# Patient Record
Sex: Female | Born: 1945 | Race: White | Hispanic: No | Marital: Married | Smoking: Never smoker
Health system: Southern US, Community
[De-identification: ages and names within clinical notes are randomized; demographics above are authoritative.]

## PROBLEM LIST (undated history)

## (undated) DIAGNOSIS — D509 Iron deficiency anemia, unspecified: Secondary | ICD-10-CM

## (undated) DIAGNOSIS — N952 Postmenopausal atrophic vaginitis: Secondary | ICD-10-CM

## (undated) DIAGNOSIS — E039 Hypothyroidism, unspecified: Secondary | ICD-10-CM

## (undated) DIAGNOSIS — A0472 Enterocolitis due to Clostridium difficile, not specified as recurrent: Secondary | ICD-10-CM

## (undated) DIAGNOSIS — M8580 Other specified disorders of bone density and structure, unspecified site: Secondary | ICD-10-CM

## (undated) DIAGNOSIS — L729 Follicular cyst of the skin and subcutaneous tissue, unspecified: Secondary | ICD-10-CM

## (undated) DIAGNOSIS — R109 Unspecified abdominal pain: Secondary | ICD-10-CM

## (undated) DIAGNOSIS — K219 Gastro-esophageal reflux disease without esophagitis: Secondary | ICD-10-CM

## (undated) DIAGNOSIS — K589 Irritable bowel syndrome without diarrhea: Secondary | ICD-10-CM

## (undated) DIAGNOSIS — K5790 Diverticulosis of intestine, part unspecified, without perforation or abscess without bleeding: Secondary | ICD-10-CM

## (undated) DIAGNOSIS — J309 Allergic rhinitis, unspecified: Secondary | ICD-10-CM

## (undated) DIAGNOSIS — G4700 Insomnia, unspecified: Secondary | ICD-10-CM

## (undated) DIAGNOSIS — E7800 Pure hypercholesterolemia, unspecified: Secondary | ICD-10-CM

## (undated) DIAGNOSIS — G2581 Restless legs syndrome: Secondary | ICD-10-CM

## (undated) DIAGNOSIS — F419 Anxiety disorder, unspecified: Secondary | ICD-10-CM

## (undated) DIAGNOSIS — D72829 Elevated white blood cell count, unspecified: Secondary | ICD-10-CM

## (undated) DIAGNOSIS — K529 Noninfective gastroenteritis and colitis, unspecified: Secondary | ICD-10-CM

## (undated) DIAGNOSIS — H9313 Tinnitus, bilateral: Secondary | ICD-10-CM

## (undated) DIAGNOSIS — E079 Disorder of thyroid, unspecified: Secondary | ICD-10-CM

## (undated) DIAGNOSIS — R102 Pelvic and perineal pain: Secondary | ICD-10-CM

## (undated) DIAGNOSIS — M549 Dorsalgia, unspecified: Secondary | ICD-10-CM

## (undated) HISTORY — PX: BREAST ENHANCEMENT SURGERY: SHX7

## (undated) HISTORY — DX: Postmenopausal atrophic vaginitis: N95.2

## (undated) HISTORY — DX: Noninfective gastroenteritis and colitis, unspecified: K52.9

## (undated) HISTORY — PX: UPPER GI ENDOSCOPY: SHX6162

## (undated) HISTORY — DX: Iron deficiency anemia, unspecified: D50.9

## (undated) HISTORY — DX: Diverticulosis of intestine, part unspecified, without perforation or abscess without bleeding: K57.90

## (undated) HISTORY — PX: AUGMENTATION MAMMAPLASTY: SUR837

## (undated) HISTORY — DX: Pelvic and perineal pain: R10.2

## (undated) HISTORY — DX: Anxiety disorder, unspecified: F41.9

## (undated) HISTORY — DX: Restless legs syndrome: G25.81

## (undated) HISTORY — DX: Enterocolitis due to Clostridium difficile, not specified as recurrent: A04.72

## (undated) HISTORY — PX: COLONOSCOPY: SHX174

## (undated) HISTORY — DX: Unspecified abdominal pain: R10.9

## (undated) HISTORY — DX: Hypothyroidism, unspecified: E03.9

## (undated) HISTORY — DX: Dorsalgia, unspecified: M54.9

## (undated) HISTORY — DX: Elevated white blood cell count, unspecified: D72.829

## (undated) HISTORY — PX: BREAST IMPLANT EXCHANGE: SHX6296

## (undated) HISTORY — DX: Pure hypercholesterolemia, unspecified: E78.00

## (undated) HISTORY — DX: Allergic rhinitis, unspecified: J30.9

## (undated) HISTORY — DX: Insomnia, unspecified: G47.00

## (undated) HISTORY — DX: Tinnitus, bilateral: H93.13

---

## 2001-08-07 ENCOUNTER — Encounter: Payer: Self-pay | Admitting: Family Medicine

## 2001-08-07 ENCOUNTER — Ambulatory Visit (HOSPITAL_COMMUNITY): Admission: RE | Admit: 2001-08-07 | Discharge: 2001-08-07 | Payer: Self-pay | Admitting: Family Medicine

## 2003-05-12 ENCOUNTER — Other Ambulatory Visit: Admission: RE | Admit: 2003-05-12 | Discharge: 2003-05-12 | Payer: Self-pay | Admitting: Family Medicine

## 2004-06-13 ENCOUNTER — Other Ambulatory Visit: Admission: RE | Admit: 2004-06-13 | Discharge: 2004-06-13 | Payer: Self-pay | Admitting: Family Medicine

## 2005-07-30 ENCOUNTER — Other Ambulatory Visit: Admission: RE | Admit: 2005-07-30 | Discharge: 2005-07-30 | Payer: Self-pay | Admitting: Family Medicine

## 2005-11-22 ENCOUNTER — Encounter: Admission: RE | Admit: 2005-11-22 | Discharge: 2006-02-20 | Payer: Self-pay | Admitting: Family Medicine

## 2006-08-13 ENCOUNTER — Other Ambulatory Visit: Admission: RE | Admit: 2006-08-13 | Discharge: 2006-08-13 | Payer: Self-pay | Admitting: Family Medicine

## 2007-08-18 ENCOUNTER — Other Ambulatory Visit: Admission: RE | Admit: 2007-08-18 | Discharge: 2007-08-18 | Payer: Self-pay | Admitting: Family Medicine

## 2008-08-18 ENCOUNTER — Other Ambulatory Visit: Admission: RE | Admit: 2008-08-18 | Discharge: 2008-08-18 | Payer: Self-pay | Admitting: Family Medicine

## 2011-10-22 ENCOUNTER — Other Ambulatory Visit (HOSPITAL_COMMUNITY)
Admission: RE | Admit: 2011-10-22 | Discharge: 2011-10-22 | Disposition: A | Discharge status: Home / Self Care | Payer: Medicare PPO | Source: Ambulatory Visit | Attending: Family Medicine | Admitting: Family Medicine

## 2011-10-22 ENCOUNTER — Other Ambulatory Visit: Payer: Self-pay | Admitting: Family Medicine

## 2011-10-22 DIAGNOSIS — Z124 Encounter for screening for malignant neoplasm of cervix: Secondary | ICD-10-CM | POA: Insufficient documentation

## 2014-09-27 ENCOUNTER — Other Ambulatory Visit: Payer: Self-pay | Admitting: Family Medicine

## 2014-09-27 DIAGNOSIS — R1084 Generalized abdominal pain: Secondary | ICD-10-CM

## 2014-09-29 ENCOUNTER — Ambulatory Visit
Admission: RE | Admit: 2014-09-29 | Discharge: 2014-09-29 | Disposition: A | Discharge status: Home / Self Care | Payer: Medicare Other | Source: Ambulatory Visit | Attending: Family Medicine | Admitting: Family Medicine

## 2014-09-29 DIAGNOSIS — R1084 Generalized abdominal pain: Secondary | ICD-10-CM

## 2014-09-29 MED ORDER — IOPAMIDOL (ISOVUE-300) INJECTION 61%
100.0000 mL | Freq: Once | INTRAVENOUS | Status: AC | PRN
  Administered 2014-09-29: 100 mL via INTRAVENOUS

## 2014-11-28 ENCOUNTER — Emergency Department (EMERGENCY_DEPARTMENT_HOSPITAL)
Admit: 2014-11-28 | Discharge: 2014-11-28 | Disposition: A | Discharge status: Home / Self Care | Payer: Medicare Other | Attending: Emergency Medicine | Admitting: Emergency Medicine

## 2014-11-28 ENCOUNTER — Encounter (HOSPITAL_COMMUNITY): Payer: Self-pay | Admitting: Physical Medicine and Rehabilitation

## 2014-11-28 ENCOUNTER — Emergency Department (HOSPITAL_COMMUNITY)
Admission: EM | Admit: 2014-11-28 | Discharge: 2014-11-28 | Disposition: A | Discharge status: Home / Self Care | Payer: Medicare Other | Attending: Emergency Medicine | Admitting: Emergency Medicine

## 2014-11-28 ENCOUNTER — Emergency Department (HOSPITAL_COMMUNITY): Payer: Medicare Other

## 2014-11-28 DIAGNOSIS — R002 Palpitations: Secondary | ICD-10-CM | POA: Insufficient documentation

## 2014-11-28 DIAGNOSIS — E0580 Other thyrotoxicosis without thyrotoxic crisis or storm: Secondary | ICD-10-CM | POA: Insufficient documentation

## 2014-11-28 DIAGNOSIS — M79662 Pain in left lower leg: Secondary | ICD-10-CM | POA: Diagnosis not present

## 2014-11-28 DIAGNOSIS — M79609 Pain in unspecified limb: Secondary | ICD-10-CM | POA: Diagnosis not present

## 2014-11-28 HISTORY — DX: Disorder of thyroid, unspecified: E07.9

## 2014-11-28 LAB — CBC WITH DIFFERENTIAL/PLATELET
Basophils Absolute: 0 10*3/uL (ref 0.0–0.1)
Basophils Relative: 0 %
Eosinophils Absolute: 0 10*3/uL (ref 0.0–0.7)
Eosinophils Relative: 0 %
HCT: 41.9 % (ref 36.0–46.0)
Hemoglobin: 14.5 g/dL (ref 12.0–15.0)
Lymphocytes Relative: 16 %
Lymphs Abs: 1.6 10*3/uL (ref 0.7–4.0)
MCH: 31.8 pg (ref 26.0–34.0)
MCHC: 34.6 g/dL (ref 30.0–36.0)
MCV: 91.9 fL (ref 78.0–100.0)
Monocytes Absolute: 0.3 10*3/uL (ref 0.1–1.0)
Monocytes Relative: 3 %
Neutro Abs: 8 10*3/uL — ABNORMAL HIGH (ref 1.7–7.7)
Neutrophils Relative %: 81 %
Platelets: 342 10*3/uL (ref 150–400)
RBC: 4.56 MIL/uL (ref 3.87–5.11)
RDW: 13 % (ref 11.5–15.5)
WBC: 10 10*3/uL (ref 4.0–10.5)

## 2014-11-28 LAB — I-STAT TROPONIN, ED: Troponin i, poc: 0 ng/mL (ref 0.00–0.08)

## 2014-11-28 LAB — COMPREHENSIVE METABOLIC PANEL
ALT: 28 U/L (ref 14–54)
AST: 31 U/L (ref 15–41)
Albumin: 4.2 g/dL (ref 3.5–5.0)
Alkaline Phosphatase: 75 U/L (ref 38–126)
Anion gap: 17 — ABNORMAL HIGH (ref 5–15)
BUN: 8 mg/dL (ref 6–20)
CO2: 19 mmol/L — ABNORMAL LOW (ref 22–32)
Calcium: 10 mg/dL (ref 8.9–10.3)
Chloride: 103 mmol/L (ref 101–111)
Creatinine, Ser: 0.55 mg/dL (ref 0.44–1.00)
GFR calc Af Amer: >60 mL/min (ref >60)
GFR calc non Af Amer: >60 mL/min (ref >60)
Glucose, Bld: 129 mg/dL — ABNORMAL HIGH (ref 65–99)
Potassium: 3.5 mmol/L (ref 3.5–5.1)
Sodium: 139 mmol/L (ref 135–145)
Total Bilirubin: 0.9 mg/dL (ref 0.3–1.2)
Total Protein: 7.3 g/dL (ref 6.5–8.1)

## 2014-11-28 LAB — TSH: TSH: 0.161 u[IU]/mL — ABNORMAL LOW (ref 0.350–4.500)

## 2014-11-28 LAB — T4, FREE: Free T4: 2.61 ng/dL — ABNORMAL HIGH (ref 0.61–1.12)

## 2014-11-28 NOTE — ED Notes (Signed)
Patient's husband placed in the room. Pt in Xray.

## 2014-11-28 NOTE — ED Notes (Signed)
Pt placed in room.  

## 2014-11-28 NOTE — ED Notes (Signed)
Pt returned from Vascular. 

## 2014-11-28 NOTE — Progress Notes (Signed)
*  PRELIMINARY RESULTS* Vascular Ultrasound Left lower extremity venous duplex has been completed.  Preliminary findings: negative for DVT. Baker's cyst noted.   Landry Mellow, RDMS, RVT  11/28/2014, 1:24 PM

## 2014-11-28 NOTE — ED Notes (Signed)
Pt presents to department for evaluation of palpitations. Pt reports feeling of heart "racing" this morning. Recently reports she had issues with thyroid. Pt denies chest pain, respirations unlabored.

## 2014-11-28 NOTE — Discharge Instructions (Signed)
Hold dose of levothyroxine tomorrow and contact your doctor for further dosing instructions  Palpitations A palpitation is the feeling that your heartbeat is irregular. It may feel like your heart is fluttering or skipping a beat. It may also feel like your heart is beating faster than normal. This is usually not a serious problem. In some cases, you may need more medical tests. HOME CARE  Avoid:  Caffeine in coffee, tea, soft drinks, diet pills, and energy drinks.  Chocolate.  Alcohol.  Stop smoking if you smoke.  Reduce your stress and anxiety. Try:  A method that measures bodily functions so you can learn to control them (biofeedback).  Yoga.  Meditation.  Physical activity such as swimming, jogging, or walking.  Get plenty of rest and sleep. GET HELP IF:  Your fast or irregular heartbeat continues after 24 hours.  Your palpitations occur more often. GET HELP RIGHT AWAY IF:   You have chest pain.  You feel short of breath.  You have a very bad headache.  You feel dizzy or pass out (faint). MAKE SURE YOU:   Understand these instructions.  Will watch your condition.  Will get help right away if you are not doing well or get worse.   This information is not intended to replace advice given to you by your health care provider. Make sure you discuss any questions you have with your health care provider.   Document Released: 10/24/2007 Document Revised: 02/04/2014 Document Reviewed: 03/15/2011 Elsevier Interactive Patient Education [employer].

## 2014-11-28 NOTE — ED Provider Notes (Signed)
CSN: [id number]     Arrival date & time 11/28/14  1114 History   First MD Initiated Contact with Patient 11/28/14 1151     Chief Complaint  Patient presents with  . Palpitations     (Consider location/radiation/quality/duration/timing/severity/associated sxs/prior Treatment) HPI Comments: Presents to the emergency department for evaluation of palpitations. Patient reports that she started to feel like her heart was racing earlier this morning. Heart rate is in the low 100s. She has a history of thyroid disease, is concerned that the symptoms might be from her thyroid. She also, however, did a lot of walking yesterday while shopping and noticed some pain and swelling on the outside part of her lower leg. Her doctor told her that she might have a blood clot and asked her to come to the ER. She is not expressing any chest pain, short of breath, passing out.  Patient is a 69 y.o. female presenting with palpitations.  Palpitations   Past Medical History  Diagnosis Date  . Thyroid disease    History reviewed. No pertinent past surgical history. No family history on file. Social History  Substance Use Topics  . Smoking status: Never Smoker   . Smokeless tobacco: None  . Alcohol Use: No   OB History    No data available     Review of Systems  Cardiovascular: Positive for palpitations.  All other systems reviewed and are negative.     Allergies  Review of patient's allergies indicates no known allergies.  Home Medications   Prior to Admission medications   Not on File   BP 175/91 mmHg  Pulse 106  Temp(Src) 98.4 F (36.9 C) (Oral)  Resp 18  Ht 5\' 5"  (1.651 m)  Wt 140 lb (63.504 kg)  BMI 23.30 kg/m2  SpO2 98% Physical Exam  Constitutional: She is oriented to person, place, and time. She appears well-developed and well-nourished. No distress.  HENT:  Head: Normocephalic and atraumatic.  Right Ear: Hearing normal.  Left Ear: Hearing normal.  Nose: Nose normal.    Mouth/Throat: Oropharynx is clear and moist and mucous membranes are normal.  Eyes: Conjunctivae and EOM are normal. Pupils are equal, round, and reactive to light.  Neck: Normal range of motion. Neck supple.  Cardiovascular: Regular rhythm, S1 normal and S2 normal.  Exam reveals no gallop and no friction rub.   No murmur heard. Pulmonary/Chest: Effort normal and breath sounds normal. No respiratory distress. She exhibits no tenderness.  Abdominal: Soft. Normal appearance and bowel sounds are normal. There is no hepatosplenomegaly. There is no tenderness. There is no rebound, no guarding, no tenderness at McBurney's point and negative Murphy's sign. No hernia.  Musculoskeletal: Normal range of motion.       Left lower leg: She exhibits tenderness. She exhibits no swelling.       Legs: Neurological: She is alert and oriented to person, place, and time. She has normal strength. No cranial nerve deficit or sensory deficit. Coordination normal. GCS eye subscore is 4. GCS verbal subscore is 5. GCS motor subscore is 6.  Skin: Skin is warm, dry and intact. No rash noted. No cyanosis.  Psychiatric: She has a normal mood and affect. Her speech is normal and behavior is normal. Thought content normal.  Nursing note and vitals reviewed.   ED Course  Procedures (including critical care time) Labs Review Labs Reviewed  CBC WITH DIFFERENTIAL/PLATELET - Abnormal; Notable for the following:    Neutro Abs 8.0 (*)    All other  components within normal limits  COMPREHENSIVE METABOLIC PANEL - Abnormal; Notable for the following:    CO2 19 (*)    Glucose, Bld 129 (*)    Anion gap 17 (*)    All other components within normal limits  TSH  I-STAT TROPOININ, ED    Imaging Review Dg Chest 2 View  11/28/2014  CLINICAL DATA:  Palpitations, thyroid disease, symptoms for 3 days worse in last 24 hours EXAM: CHEST  2 VIEW COMPARISON:  None FINDINGS: Normal heart size and pulmonary vascularity. Tortuous thoracic  aorta. Lungs mildly hyperinflated but clear. No pleural effusion or pneumothorax. Bones unremarkable. BILATERAL breast prostheses. IMPRESSION: No acute abnormalities. Electronically Signed   By: Lavonia Dana M.D.   On: 11/28/2014 12:05   I have personally reviewed and evaluated these images and lab results as part of my medical decision-making.   EKG Interpretation   Date/Time:  Monday November 28 2014 11:28:18 EDT Ventricular Rate:  95 PR Interval:  144 QRS Duration: 80 QT Interval:  360 QTC Calculation: 452 R Axis:   -31 Text Interpretation:  Normal sinus rhythm Possible Left atrial enlargement  Left axis deviation Nonspecific ST and T wave abnormality Abnormal ECG No  previous tracing Confirmed by POLLINA  MD, CHRISTOPHER ([id number]) on  11/28/2014 12:18:54 PM      MDM   Final diagnoses:  None  Palpitations Hyperthyroid  Patient presents to the emergency department for evaluation of palpitations. She reports that she was feeling her heart pounding in her chest but has not been tachycardic. Vital signs are stable. Patient does take thyroid supplementation and has had an increase in her dosing. She does have evidence of hyperthyroid today. TSH is low, free T4 is elevated. Patient will hold her thyroid dose tomorrow, contact her primary doctor for further dosing instructions.    Orpah Greek, MD 11/28/14 1504

## 2014-11-28 NOTE — ED Notes (Signed)
MD Pollina at the bedside.  

## 2014-11-29 LAB — T3, FREE: T3, Free: 3.3 pg/mL (ref 2.0–4.4)

## 2016-02-19 ENCOUNTER — Other Ambulatory Visit: Payer: Self-pay | Admitting: Family Medicine

## 2016-02-19 DIAGNOSIS — M79604 Pain in right leg: Secondary | ICD-10-CM

## 2016-02-19 DIAGNOSIS — M25569 Pain in unspecified knee: Secondary | ICD-10-CM

## 2016-02-20 ENCOUNTER — Ambulatory Visit
Admission: RE | Admit: 2016-02-20 | Discharge: 2016-02-20 | Disposition: A | Discharge status: Home / Self Care | Payer: Medicare Other | Source: Ambulatory Visit | Attending: Family Medicine | Admitting: Family Medicine

## 2016-02-20 DIAGNOSIS — M25569 Pain in unspecified knee: Secondary | ICD-10-CM

## 2016-02-20 DIAGNOSIS — M79604 Pain in right leg: Secondary | ICD-10-CM

## 2016-06-19 ENCOUNTER — Ambulatory Visit: Payer: Medicare Other | Admitting: Family Medicine

## 2016-06-28 HISTORY — PX: KNEE ARTHROSCOPY W/ MENISCAL REPAIR: SHX1877

## 2016-08-07 ENCOUNTER — Other Ambulatory Visit (HOSPITAL_COMMUNITY): Payer: Self-pay | Admitting: Specialist

## 2016-08-07 ENCOUNTER — Ambulatory Visit (HOSPITAL_COMMUNITY)
Admission: RE | Admit: 2016-08-07 | Discharge: 2016-08-07 | Disposition: A | Discharge status: Home / Self Care | Payer: Medicare Other | Source: Ambulatory Visit | Attending: Cardiology | Admitting: Cardiology

## 2016-08-07 DIAGNOSIS — M79661 Pain in right lower leg: Secondary | ICD-10-CM | POA: Diagnosis not present

## 2016-08-07 DIAGNOSIS — M7989 Other specified soft tissue disorders: Secondary | ICD-10-CM

## 2017-02-06 ENCOUNTER — Ambulatory Visit: Payer: Self-pay | Admitting: Surgery

## 2017-02-06 NOTE — H&P (Signed)
Connie Diaz Documented: 02/06/2017 11:18 AM Location: [facility] Patient #: [id number] DOB: 03/02/1945 Married / Language: English / Race: White Female  History of Present Illness (Connie Diaz Connie Diaz; 02/06/2017 11:27 AM) Patient words: 72 year old woman is referred for a lump on her left shoulder which has become larger over the last few months. Painful to touch but no recent infection. She states she first noticed it after having meniscus surgery in July. Initially it was pea-sized but has gradually increased in size. Initially it was not mobile but now it is.  Medical history notable for hypothyroidism, tinnitus, diverticulosis, osteopenia GERD, anxiety, insomnia, restless leg syndrome, pelvic and perineal pain, chronic back pain.  The patient is a 72 year old female.   Past Surgical History Connie Diaz, Diaz; 02/06/2017 11:18 AM) Breast Augmentation Bilateral. Knee Surgery Right.  Diagnostic Studies History Connie Diaz, Connie Diaz; 02/06/2017 11:18 AM) Colonoscopy within last year Mammogram within last year Pap Smear 1-5 years ago  Allergies Connie Diaz, Connie Diaz; 02/06/2017 11:21 AM) No Known Allergies [02/06/2017]:  Medication History Connie Diaz, Diaz; 02/06/2017 11:25 AM) Diclofenac Sodium (1% Gel, Transdermal) Active. Allegra (180MG  Tablet, Oral) Active. Aspirin (81MG  Tablet, Oral) Active. Calcium 600+D (600-400MG -UNIT Tablet, Oral) Active. EQL Evening Primrose Oil (500MG  Capsule, Oral) Active. Fluticasone Propionate (50MCG/ACT Suspension, Nasal) Active. Magnesium (200MG  Tablet, Oral) Active. Multivitamin (Oral) Active. Probiotic Product (Oral) Active. Remifemin (20MG  Tablet, Oral) Active. Requip (0.5MG  Tablet, Oral) Active. Vitamin B-12 (100MCG Tablet, Oral) Active. Vitamin B-6 (100MG  Tablet, Oral) Active. Medications Reconciled  Social History Connie Diaz, Diaz; 02/06/2017 11:18 AM) Alcohol use Occasional alcohol use. Caffeine use  Coffee. No drug use Tobacco use Never smoker.  Family History Connie Diaz, Connie Diaz; 02/06/2017 11:18 AM) Heart Disease Father. Ovarian Cancer Mother.  Pregnancy / Birth History Connie Diaz, Connie Diaz; 02/06/2017 11:18 AM) Age at menarche 61 years. Age of menopause 20-50 Contraceptive History Oral contraceptives. Gravida 3 Length (months) of breastfeeding 12-24 Maternal age 68-20 Para 3  Other Problems (Connie Diaz, Connie Diaz; 02/06/2017 11:18 AM) Diverticulosis Thyroid Disease     Review of Systems (Connie Diaz; 02/06/2017 11:18 AM) General Not Present- Appetite Loss, Chills, Fatigue, Fever, Night Sweats, Weight Gain and Weight Loss. Skin Not Present- Change in Wart/Mole, Dryness, Hives, Jaundice, New Lesions, Non-Healing Wounds, Rash and Ulcer. HEENT Present- Ringing in the Ears, Seasonal Allergies and Wears glasses/contact lenses. Not Present- Earache, Hearing Loss, Hoarseness, Nose Bleed, Oral Ulcers, Sinus Pain, Sore Throat, Visual Disturbances and Yellow Eyes. Respiratory Not Present- Bloody sputum, Chronic Cough, Difficulty Breathing, Snoring and Wheezing. Breast Not Present- Breast Mass, Breast Pain, Nipple Discharge and Skin Changes. Cardiovascular Not Present- Chest Pain, Difficulty Breathing Lying Down, Leg Cramps, Palpitations, Rapid Heart Rate, Shortness of Breath and Swelling of Extremities. Gastrointestinal Not Present- Abdominal Pain, Bloating, Bloody Stool, Change in Bowel Habits, Chronic diarrhea, Constipation, Difficulty Swallowing, Excessive gas, Gets full quickly at meals, Hemorrhoids, Indigestion, Nausea, Rectal Pain and Vomiting. Female Genitourinary Not Present- Frequency, Nocturia, Painful Urination, Pelvic Pain and Urgency. Musculoskeletal Not Present- Back Pain, Joint Pain, Joint Stiffness, Muscle Pain, Muscle Weakness and Swelling of Extremities. Neurological Not Present- Decreased Memory, Fainting, Headaches, Numbness, Seizures, Tingling, Tremor, Trouble  walking and Weakness. Psychiatric Not Present- Anxiety, Bipolar, Change in Sleep Pattern, Depression, Fearful and Frequent crying. Endocrine Not Present- Cold Intolerance, Excessive Hunger, Hair Changes, Heat Intolerance, Hot flashes and New Diabetes. Hematology Not Present- Blood Thinners, Easy Bruising, Excessive bleeding, Gland problems, HIV and Persistent Infections.  Vitals (Connie Diaz; 02/06/2017 11:21 AM) 02/06/2017 11:20  AM Weight: 135 lb Height: 63in Body Surface Area: 1.64 m Body Mass Index: 23.91 kg/m  Temp.: 97.47F  Pulse: 57 (Regular)  P.OX: 98% (Room air) BP: 142/92 (Sitting, Left Arm, Standard)      Physical Exam (Connie Diaz; 02/06/2017 11:28 AM)  The physical exam findings are as follows: Note:Alert and oriented, no distress Extraocular motions intact, no scleral icterus Moist mucous membranes, dentition intact Regular rate and rhythm, no pedal edema Unlabored respirations, symmetrical air entry Abdomen soft and nontender no mass or organomegaly Neuro grossly intact, normal gait Extremities warm without deformity, strength symmetrical throughout Psychiatric normal mood and affect, appropriate insight Skin warm and dry, 2 cm mobile subcutaneous cyst in the left superior shoulder, no overlying skin changes.    Assessment & Plan (Connie Diaz; 02/06/2017 11:31 AM)  SUBCUTANEOUS CYST (L72.9) Story: Symptomatic and enlarging cyst on the left shoulder. Plan local excision. Discussed risks of bleeding, infection, pain, scarring, open wound, cyst recurrence. Questions were welcomed and answered. We'll schedule next month. She was going on a cruise in February and would like to wait until this is done.

## 2017-03-07 ENCOUNTER — Encounter (HOSPITAL_BASED_OUTPATIENT_CLINIC_OR_DEPARTMENT_OTHER): Payer: Self-pay

## 2017-03-14 ENCOUNTER — Encounter (HOSPITAL_BASED_OUTPATIENT_CLINIC_OR_DEPARTMENT_OTHER): Payer: Self-pay

## 2017-03-14 ENCOUNTER — Other Ambulatory Visit: Payer: Self-pay

## 2017-03-14 NOTE — Progress Notes (Signed)
Spoke with: Vielka NPO:  After Midnight, no gum, candy, or mints   Arrival time:  0630AM Labs: Hemoglobin AM medications:  Clonazepam, Levothyroxine, Allegra Pre op orders: Yes Ride home:  Jeneen Rinks (husband) [phone]

## 2017-03-19 ENCOUNTER — Ambulatory Visit (HOSPITAL_BASED_OUTPATIENT_CLINIC_OR_DEPARTMENT_OTHER): Payer: Medicare Other | Admitting: Anesthesiology

## 2017-03-19 ENCOUNTER — Other Ambulatory Visit: Payer: Self-pay

## 2017-03-19 ENCOUNTER — Encounter (HOSPITAL_BASED_OUTPATIENT_CLINIC_OR_DEPARTMENT_OTHER)
Admission: RE | Disposition: A | Discharge status: Home / Self Care | Payer: Self-pay | Source: Ambulatory Visit | Attending: Surgery

## 2017-03-19 ENCOUNTER — Encounter (HOSPITAL_BASED_OUTPATIENT_CLINIC_OR_DEPARTMENT_OTHER): Payer: Self-pay | Admitting: *Deleted

## 2017-03-19 ENCOUNTER — Ambulatory Visit (HOSPITAL_BASED_OUTPATIENT_CLINIC_OR_DEPARTMENT_OTHER)
Admission: RE | Admit: 2017-03-19 | Discharge: 2017-03-19 | Disposition: A | Discharge status: Home / Self Care | Payer: Medicare Other | Source: Ambulatory Visit | Attending: Surgery | Admitting: Surgery

## 2017-03-19 DIAGNOSIS — H9319 Tinnitus, unspecified ear: Secondary | ICD-10-CM | POA: Insufficient documentation

## 2017-03-19 DIAGNOSIS — K219 Gastro-esophageal reflux disease without esophagitis: Secondary | ICD-10-CM | POA: Insufficient documentation

## 2017-03-19 DIAGNOSIS — F419 Anxiety disorder, unspecified: Secondary | ICD-10-CM | POA: Insufficient documentation

## 2017-03-19 DIAGNOSIS — K5790 Diverticulosis of intestine, part unspecified, without perforation or abscess without bleeding: Secondary | ICD-10-CM | POA: Diagnosis not present

## 2017-03-19 DIAGNOSIS — M549 Dorsalgia, unspecified: Secondary | ICD-10-CM | POA: Diagnosis not present

## 2017-03-19 DIAGNOSIS — E039 Hypothyroidism, unspecified: Secondary | ICD-10-CM | POA: Insufficient documentation

## 2017-03-19 DIAGNOSIS — Z79899 Other long term (current) drug therapy: Secondary | ICD-10-CM | POA: Insufficient documentation

## 2017-03-19 DIAGNOSIS — Z7982 Long term (current) use of aspirin: Secondary | ICD-10-CM | POA: Diagnosis not present

## 2017-03-19 DIAGNOSIS — Z8249 Family history of ischemic heart disease and other diseases of the circulatory system: Secondary | ICD-10-CM | POA: Diagnosis not present

## 2017-03-19 DIAGNOSIS — Z8041 Family history of malignant neoplasm of ovary: Secondary | ICD-10-CM | POA: Insufficient documentation

## 2017-03-19 DIAGNOSIS — R102 Pelvic and perineal pain: Secondary | ICD-10-CM | POA: Insufficient documentation

## 2017-03-19 DIAGNOSIS — G8929 Other chronic pain: Secondary | ICD-10-CM | POA: Diagnosis not present

## 2017-03-19 DIAGNOSIS — M8580 Other specified disorders of bone density and structure, unspecified site: Secondary | ICD-10-CM | POA: Insufficient documentation

## 2017-03-19 DIAGNOSIS — L729 Follicular cyst of the skin and subcutaneous tissue, unspecified: Secondary | ICD-10-CM | POA: Diagnosis not present

## 2017-03-19 DIAGNOSIS — G4700 Insomnia, unspecified: Secondary | ICD-10-CM | POA: Insufficient documentation

## 2017-03-19 DIAGNOSIS — G2581 Restless legs syndrome: Secondary | ICD-10-CM | POA: Insufficient documentation

## 2017-03-19 HISTORY — DX: Other specified disorders of bone density and structure, unspecified site: M85.80

## 2017-03-19 HISTORY — PX: MASS EXCISION: SHX2000

## 2017-03-19 HISTORY — DX: Follicular cyst of the skin and subcutaneous tissue, unspecified: L72.9

## 2017-03-19 HISTORY — DX: Gastro-esophageal reflux disease without esophagitis: K21.9

## 2017-03-19 HISTORY — DX: Hypothyroidism, unspecified: E03.9

## 2017-03-19 LAB — POCT HEMOGLOBIN-HEMACUE: Hemoglobin: 12.7 g/dL (ref 12.0–15.0)

## 2017-03-19 SURGERY — EXCISION MASS
Anesthesia: Monitor Anesthesia Care | Laterality: Left

## 2017-03-19 MED ORDER — PROMETHAZINE HCL 25 MG/ML IJ SOLN
6.2500 mg | INTRAMUSCULAR | Status: DC | PRN
  Filled 2017-03-19: qty 1

## 2017-03-19 MED ORDER — SODIUM CHLORIDE 0.9% FLUSH
3.0000 mL | Freq: Two times a day (BID) | INTRAVENOUS | Status: DC
  Filled 2017-03-19: qty 3

## 2017-03-19 MED ORDER — ACETAMINOPHEN 650 MG RE SUPP
650.0000 mg | RECTAL | Status: DC | PRN
  Filled 2017-03-19: qty 1

## 2017-03-19 MED ORDER — LIDOCAINE 2% (20 MG/ML) 5 ML SYRINGE
INTRAMUSCULAR | Status: DC | PRN
  Administered 2017-03-19: 50 mg via INTRAVENOUS

## 2017-03-19 MED ORDER — IBUPROFEN 800 MG PO TABS: 800.0000 mg | ORAL_TABLET | Freq: Three times a day (TID) | ORAL | 0 refills | Status: DC | PRN

## 2017-03-19 MED ORDER — CHLORHEXIDINE GLUCONATE 4 % EX LIQD
60.0000 mL | Freq: Once | CUTANEOUS | Status: DC
  Filled 2017-03-19: qty 118

## 2017-03-19 MED ORDER — PROPOFOL 10 MG/ML IV BOLUS
INTRAVENOUS | Status: AC
  Filled 2017-03-19: qty 20

## 2017-03-19 MED ORDER — LIDOCAINE HCL (PF) 1 % IJ SOLN
INTRAMUSCULAR | Status: DC | PRN
  Administered 2017-03-19: 1 mL

## 2017-03-19 MED ORDER — ACETAMINOPHEN 500 MG PO TABS
ORAL_TABLET | ORAL | Status: AC
  Filled 2017-03-19: qty 2

## 2017-03-19 MED ORDER — FENTANYL CITRATE (PF) 100 MCG/2ML IJ SOLN
25.0000 ug | INTRAMUSCULAR | Status: DC | PRN
  Filled 2017-03-19: qty 1

## 2017-03-19 MED ORDER — ACETAMINOPHEN 500 MG PO TABS
1000.0000 mg | ORAL_TABLET | ORAL | Status: AC
  Administered 2017-03-19: 1000 mg via ORAL
  Filled 2017-03-19: qty 2

## 2017-03-19 MED ORDER — FENTANYL CITRATE (PF) 100 MCG/2ML IJ SOLN
INTRAMUSCULAR | Status: DC | PRN
  Administered 2017-03-19: 50 ug via INTRAVENOUS

## 2017-03-19 MED ORDER — FENTANYL CITRATE (PF) 100 MCG/2ML IJ SOLN
INTRAMUSCULAR | Status: AC
  Filled 2017-03-19: qty 2

## 2017-03-19 MED ORDER — SODIUM CHLORIDE 0.9% FLUSH
3.0000 mL | INTRAVENOUS | Status: DC | PRN
  Filled 2017-03-19: qty 3

## 2017-03-19 MED ORDER — ACETAMINOPHEN 325 MG PO TABS
650.0000 mg | ORAL_TABLET | ORAL | Status: DC | PRN
  Filled 2017-03-19: qty 2

## 2017-03-19 MED ORDER — MEPERIDINE HCL 25 MG/ML IJ SOLN
6.2500 mg | INTRAMUSCULAR | Status: DC | PRN
  Filled 2017-03-19: qty 1

## 2017-03-19 MED ORDER — BUPIVACAINE-EPINEPHRINE 0.25% -1:200000 IJ SOLN
INTRAMUSCULAR | Status: DC | PRN
  Administered 2017-03-19: 1 mL

## 2017-03-19 MED ORDER — LIDOCAINE 2% (20 MG/ML) 5 ML SYRINGE
INTRAMUSCULAR | Status: AC
  Filled 2017-03-19: qty 5

## 2017-03-19 MED ORDER — MIDAZOLAM HCL 2 MG/2ML IJ SOLN
INTRAMUSCULAR | Status: AC
  Filled 2017-03-19: qty 2

## 2017-03-19 MED ORDER — SODIUM CHLORIDE 0.9 % IV SOLN
250.0000 mL | INTRAVENOUS | Status: DC | PRN
  Filled 2017-03-19: qty 250

## 2017-03-19 MED ORDER — PROPOFOL 500 MG/50ML IV EMUL
INTRAVENOUS | Status: DC | PRN
  Administered 2017-03-19: 25 ug/kg/min via INTRAVENOUS

## 2017-03-19 MED ORDER — CEFAZOLIN SODIUM-DEXTROSE 2-4 GM/100ML-% IV SOLN
INTRAVENOUS | Status: AC
  Filled 2017-03-19: qty 100

## 2017-03-19 MED ORDER — ACETAMINOPHEN 325 MG PO TABS: 650.0000 mg | ORAL_TABLET | Freq: Four times a day (QID) | ORAL | 0 refills | Status: AC | PRN

## 2017-03-19 MED ORDER — OXYCODONE HCL 5 MG PO TABS
5.0000 mg | ORAL_TABLET | ORAL | Status: DC | PRN
  Filled 2017-03-19: qty 2

## 2017-03-19 MED ORDER — LACTATED RINGERS IV SOLN
INTRAVENOUS | Status: DC
  Administered 2017-03-19: 07:00:00 via INTRAVENOUS
  Filled 2017-03-19: qty 1000

## 2017-03-19 MED ORDER — CEFAZOLIN SODIUM-DEXTROSE 2-4 GM/100ML-% IV SOLN
2.0000 g | INTRAVENOUS | Status: AC
  Administered 2017-03-19: 2 g via INTRAVENOUS
  Filled 2017-03-19: qty 100

## 2017-03-19 MED ORDER — LACTATED RINGERS IV SOLN
INTRAVENOUS | Status: DC
  Filled 2017-03-19: qty 1000

## 2017-03-19 SURGICAL SUPPLY — 33 items
ADH SKN CLS APL DERMABOND .7 (GAUZE/BANDAGES/DRESSINGS) ×1
BLADE SURG 15 STRL LF DISP TIS (BLADE) ×1 IMPLANT
BLADE SURG 15 STRL SS (BLADE) ×2
COVER MAYO STAND STRL (DRAPES) ×2 IMPLANT
COVER TABLE BACK 60X90 (DRAPES) ×2 IMPLANT
DERMABOND ADVANCED (GAUZE/BANDAGES/DRESSINGS) ×1
DERMABOND ADVANCED .7 DNX12 (GAUZE/BANDAGES/DRESSINGS) ×1 IMPLANT
DRAPE LAPAROTOMY 100X72 PEDS (DRAPES) ×1 IMPLANT
DRAPE LAPAROTOMY TRNSV 102X78 (DRAPE) IMPLANT
DRAPE UTILITY XL STRL (DRAPES) ×2 IMPLANT
GAUZE SPONGE 4X4 12PLY STRL (GAUZE/BANDAGES/DRESSINGS) ×2 IMPLANT
GLOVE BIO SURGEON STRL SZ 6 (GLOVE) ×2 IMPLANT
GLOVE INDICATOR 6.5 STRL GRN (GLOVE) ×2 IMPLANT
GOWN STRL REUS W/TWL LRG LVL3 (GOWN DISPOSABLE) ×2 IMPLANT
NDL HYPO 25X1 1.5 SAFETY (NEEDLE) IMPLANT
NEEDLE HYPO 22GX1.5 SAFETY (NEEDLE) IMPLANT
NEEDLE HYPO 25X1 1.5 SAFETY (NEEDLE) IMPLANT
PACK BASIN DAY SURGERY FS (CUSTOM PROCEDURE TRAY) ×2 IMPLANT
PENCIL BUTTON HOLSTER BLD 10FT (ELECTRODE) ×2 IMPLANT
SPONGE LAP 18X18 X RAY DECT (DISPOSABLE) IMPLANT
SPONGE LAP 4X18 X RAY DECT (DISPOSABLE) IMPLANT
STAPLER VISISTAT 35W (STAPLE) IMPLANT
SUCTION FRAZIER TIP 10 FR DISP (SUCTIONS) IMPLANT
SUT MNCRL AB 4-0 PS2 18 (SUTURE) ×2 IMPLANT
SUT VIC AB 2-0 SH 27 (SUTURE) ×2
SUT VIC AB 2-0 SH 27XBRD (SUTURE) ×1 IMPLANT
SUT VIC AB 3-0 SH 27 (SUTURE) ×2
SUT VIC AB 3-0 SH 27X BRD (SUTURE) ×1 IMPLANT
SYR BULB IRRIGATION 50ML (SYRINGE) ×2 IMPLANT
SYR CONTROL 10ML LL (SYRINGE) ×2 IMPLANT
TOWEL OR 17X24 6PK STRL BLUE (TOWEL DISPOSABLE) ×4 IMPLANT
TUBE CONNECTING 12X1/4 (SUCTIONS) IMPLANT
YANKAUER SUCT BULB TIP NO VENT (SUCTIONS) IMPLANT

## 2017-03-19 NOTE — Anesthesia Procedure Notes (Signed)
Procedure Name: MAC Date/Time: 03/19/2017 8:42 AM Performed by: Bonney Aid, CRNA Pre-anesthesia Checklist: Patient identified, Timeout performed, Emergency Drugs available, Suction available and Patient being monitored Patient Re-evaluated:Patient Re-evaluated prior to induction Oxygen Delivery Method: Nasal cannula Placement Confirmation: positive ETCO2

## 2017-03-19 NOTE — Op Note (Signed)
Operative Note  Connie Diaz  [id number]  [id number]  03/19/2017   Surgeon: Vikki Ports A ConnorMD  Assistant: None  Procedure performed: Excision of 1cm cyst from left shoulder. Cyst was subcutaneous but appeared to originate from the clavicle.   Preop diagnosis: left shoudler cyst Post-op diagnosis/intraop findings: same  Specimens: left shoulder cyst Retained items: no EBL: minimal cc Complications: none  Description of procedure: After obtaining informed consent the patient was taken to the operating room and placed supine on operating room table [facility] was initiated, preoperative antibiotics were administered, SCDs applied, and a formal timeout was performed. The area of the cyst in the left shoulder was prepped and draped in usual sterile fashion. After infiltration with local, a 2 cm incision was made over the region of the cyst and the soft tissues were dissected bluntly until the cyst was able to be elevated into the field. Continued sharp and cautery dissection were used to isolate the cyst which although it had seemed to be subcutaneous, did seem to emanate from the clavicle periosteum. Using cautery the cyst was divided from the periosteum and removed. Hemostasis was ensured within the wound. The skin was then closed with one deep dermal 3-0 Vicryl and running subcuticular Monocryl. Dermabond was applied. The patient was then awakened and taken to PACU in stable condition.   All counts were correct at the completion of the case.

## 2017-03-19 NOTE — Anesthesia Preprocedure Evaluation (Addendum)
Anesthesia Evaluation  Patient identified by MRN, date of birth, ID band Patient awake    Reviewed: Allergy & Precautions, NPO status , Patient's Chart, lab work & pertinent test results  Airway Mallampati: I  TM Distance: >3 FB Neck ROM: Full    Dental  (+) Teeth Intact, Dental Advisory Given   Pulmonary neg pulmonary ROS,    breath sounds clear to auscultation       Cardiovascular negative cardio ROS   Rhythm:Regular Rate:Normal     Neuro/Psych negative neurological ROS  negative psych ROS   GI/Hepatic Neg liver ROS, GERD  ,  Endo/Other  Hypothyroidism   Renal/GU negative Renal ROS     Musculoskeletal negative musculoskeletal ROS (+)   Abdominal Normal abdominal exam  (+)   Peds  Hematology negative hematology ROS (+)   Anesthesia Other Findings   Reproductive/Obstetrics                            Anesthesia Physical Anesthesia Plan  ASA: II  Anesthesia Plan: MAC   Post-op Pain Management:    Induction: Intravenous  PONV Risk Score and Plan: 3 and Propofol infusion, Ondansetron and Midazolam  Airway Management Planned: Natural Airway and Simple Face Mask  Additional Equipment: None  Intra-op Plan:   Post-operative Plan:   Informed Consent: I have reviewed the patients History and Physical, chart, labs and discussed the procedure including the risks, benefits and alternatives for the proposed anesthesia with the patient or authorized representative who has indicated his/her understanding and acceptance.     Plan Discussed with: CRNA  Anesthesia Plan Comments:        Anesthesia Quick Evaluation

## 2017-03-19 NOTE — H&P (Signed)
Connie Diaz DOB: 17-Aug-1945 Married / Language: English / Race: White Female  History of Present Illness  Patient words: 72 year old woman is referred for a lump on her left shoulder which has become larger over the last few months. Painful to touch but no recent infection. She states she first noticed it after having meniscus surgery in July. Initially it was pea-sized but has gradually increased in size. Initially it was not mobile but now it is.  Medical history notable for hypothyroidism, tinnitus, diverticulosis, osteopenia GERD, anxiety, insomnia, restless leg syndrome, pelvic and perineal pain, chronic back pain.    Past Surgical History  Breast Augmentation Bilateral. Knee Surgery Right.  Diagnostic Studies History  Colonoscopy within last year Mammogram within last year Pap Smear 1-5 years ago  Allergies  No Known Allergies [02/06/2017]:  Medication History  Diclofenac Sodium (1% Gel, Transdermal) Active. Allegra (180MG  Tablet, Oral) Active. Aspirin (81MG  Tablet, Oral) Active. Calcium 600+D (600-400MG -UNIT Tablet, Oral) Active. EQL Evening Primrose Oil (500MG  Capsule, Oral) Active. Fluticasone Propionate (50MCG/ACT Suspension, Nasal) Active. Magnesium (200MG  Tablet, Oral) Active. Multivitamin (Oral) Active. Probiotic Product (Oral) Active. Remifemin (20MG  Tablet, Oral) Active. Requip (0.5MG  Tablet, Oral) Active. Vitamin B-12 (100MCG Tablet, Oral) Active. Vitamin B-6 (100MG  Tablet, Oral) Active. Medications Reconciled  Social History  Alcohol use Occasional alcohol use. Caffeine use Coffee. No drug use Tobacco use Never smoker.  Family History  Heart Disease Father. Ovarian Cancer Mother.  Pregnancy / Birth History Age at menarche 29 years. Age of menopause 21-50 Contraceptive History Oral contraceptives. Gravida 3 Length (months) of breastfeeding 12-24 Maternal age 45-20 Para 3  Other Problems   Diverticulosis Thyroid Disease     Review of Systems  General Not Present- Appetite Loss, Chills, Fatigue, Fever, Night Sweats, Weight Gain and Weight Loss. Skin Not Present- Change in Wart/Mole, Dryness, Hives, Jaundice, New Lesions, Non-Healing Wounds, Rash and Ulcer. HEENT Present- Ringing in the Ears, Seasonal Allergies and Wears glasses/contact lenses. Not Present- Earache, Hearing Loss, Hoarseness, Nose Bleed, Oral Ulcers, Sinus Pain, Sore Throat, Visual Disturbances and Yellow Eyes. Respiratory Not Present- Bloody sputum, Chronic Cough, Difficulty Breathing, Snoring and Wheezing. Breast Not Present- Breast Mass, Breast Pain, Nipple Discharge and Skin Changes. Cardiovascular Not Present- Chest Pain, Difficulty Breathing Lying Down, Leg Cramps, Palpitations, Rapid Heart Rate, Shortness of Breath and Swelling of Extremities. Gastrointestinal Not Present- Abdominal Pain, Bloating, Bloody Stool, Change in Bowel Habits, Chronic diarrhea, Constipation, Difficulty Swallowing, Excessive gas, Gets full quickly at meals, Hemorrhoids, Indigestion, Nausea, Rectal Pain and Vomiting. Female Genitourinary Not Present- Frequency, Nocturia, Painful Urination, Pelvic Pain and Urgency. Musculoskeletal Not Present- Back Pain, Joint Pain, Joint Stiffness, Muscle Pain, Muscle Weakness and Swelling of Extremities. Neurological Not Present- Decreased Memory, Fainting, Headaches, Numbness, Seizures, Tingling, Tremor, Trouble walking and Weakness. Psychiatric Not Present- Anxiety, Bipolar, Change in Sleep Pattern, Depression, Fearful and Frequent crying. Endocrine Not Present- Cold Intolerance, Excessive Hunger, Hair Changes, Heat Intolerance, Hot flashes and New Diabetes. Hematology Not Present- Blood Thinners, Easy Bruising, Excessive bleeding, Gland problems, HIV and Persistent Infections.  Vitals:   03/19/17 0620  BP: 137/86  Pulse: 65  Resp: 16  Temp: 97.8 F (36.6 C)  SpO2: 96%       Physical Exam   The physical exam findings are as follows: Note:Alert and oriented, no distress Extraocular motions intact, no scleral icterus Moist mucous membranes, dentition intact Regular rate and rhythm, no pedal edema Unlabored respirations, symmetrical air entry Abdomen soft and nontender no mass or organomegaly Neuro grossly intact, normal gait  Extremities warm without deformity, strength symmetrical throughout Psychiatric normal mood and affect, appropriate insight Skin warm and dry, 1 cm mobile subcutaneous cyst in the left superior shoulder, no overlying skin changes.    Assessment & Plan   SUBCUTANEOUS CYST (L72.9) Story: Symptomatic cyst on the left shoulder. Has decreased in size in the last month. Plan local excision. Discussed risks of bleeding, infection, pain, scarring, open wound, cyst recurrence. Questions were welcomed and answered.

## 2017-03-19 NOTE — Transfer of Care (Signed)
Immediate Anesthesia Transfer of Care Note  Patient: Connie Diaz  Procedure(s) Performed: EXCISION LEFT SHOULDER CYST (Left )  Patient Location: PACU  Anesthesia Type:MAC  Level of Consciousness: awake, alert  and oriented  Airway & Oxygen Therapy: Patient Spontanous Breathing  Post-op Assessment: Report given to RN and Post -op Vital signs reviewed and stable  Post vital signs: Reviewed and stable  Last Vitals:  Vitals:   03/19/17 0620 03/19/17 0905  BP: 137/86 125/78  Pulse: 65 (!) 59  Resp: 16 16  Temp: 36.6 C (!) 36.3 C  SpO2: 96% 94%    Last Pain:  Vitals:   03/19/17 0620  TempSrc: Oral         Complications: No apparent anesthesia complications

## 2017-03-19 NOTE — Anesthesia Postprocedure Evaluation (Signed)
Anesthesia Post Note  Patient: Connie Diaz  Procedure(s) Performed: EXCISION LEFT SHOULDER CYST (Left )     Patient location during evaluation: PACU Anesthesia Type: MAC Level of consciousness: awake and alert Pain management: pain level controlled Vital Signs Assessment: post-procedure vital signs reviewed and stable Respiratory status: spontaneous breathing, nonlabored ventilation, respiratory function stable and patient connected to nasal cannula oxygen Cardiovascular status: stable and blood pressure returned to baseline Postop Assessment: no apparent nausea or vomiting Anesthetic complications: no    Last Vitals:  Vitals:   03/19/17 0915 03/19/17 0930  BP: 129/70 (!) 143/78  Pulse: 63 (!) 54  Resp: 18 10  Temp:    SpO2: 93% 98%    Last Pain:  Vitals:   03/19/17 0620  TempSrc: Oral                 Effie Berkshire

## 2017-03-19 NOTE — Discharge Instructions (Signed)
GENERAL SURGERY: POST OP INSTRUCTIONS  ######################################################################  EAT Gradually transition to a high fiber diet with a fiber supplement over the next few weeks after discharge.  Start with a pureed / full liquid diet (see below)  WALK Walk an hour a day.  Control your pain to do that.    CONTROL PAIN Control pain so that you can walk, sleep, tolerate sneezing/coughing, go up/down stairs.  HAVE A BOWEL MOVEMENT DAILY Keep your bowels regular to avoid problems.  OK to try a laxative to override constipation.  OK to use an antidairrheal to slow down diarrhea.  Call if not better after 2 tries  CALL IF YOU HAVE PROBLEMS/CONCERNS Call if you are still struggling despite following these instructions. Call if you have concerns not answered by these instructions  ######################################################################    1. DIET: Follow a light bland diet the first 24 hours after arrival home, such as soup, liquids, crackers, etc.  Be sure to include lots of fluids daily.  Avoid fast food or heavy meals as your are more likely to get nauseated.   2. Take your usually prescribed home medications unless otherwise directed. 3. PAIN CONTROL: a. Pain is best controlled by a usual combination of three different methods TOGETHER: i. Ice/Heat ii. Over the counter pain medication iii. Prescription pain medication b. Most patients will experience some swelling and bruising around the incisions.  Ice packs or heating pads (30-60 minutes up to 6 times a day) will help. Use ice for the first few days to help decrease swelling and bruising, then switch to heat to help relax tight/sore spots and speed recovery.  Some people prefer to use ice alone, heat alone, alternating between ice & heat.  Experiment to what works for you.  Swelling and bruising can take several weeks to resolve.   c. It is helpful to take an over-the-counter pain medication  regularly for the first few weeks.  Choose one of the following that works best for you: i. Naproxen (Aleve, etc)  Two 220mg  tabs twice a day ii. Ibuprofen (Advil, etc) Three 200mg  tabs four times a day (every meal & bedtime) iii. Acetaminophen (Tylenol, etc) 500-650mg  four times a day (every meal & bedtime) iv. If you are having problems/concerns with the pain, please call us [phone] to see if we need to switch you to a different pain medicine that will work better for you and/or control your side effect better. v. If you need a refill on your pain medication, please contact your pharmacy.  They will contact our office to request authorization. Prescriptions will not be filled after 5 pm or on week-ends. 4. Avoid getting constipated.  Between the surgery and the pain medications, it is common to experience some constipation.  Increasing fluid intake and taking a fiber supplement (such as Metamucil, Citrucel, FiberCon, MiraLax, etc) 1-2 times a day regularly will usually help prevent this problem from occurring.  A mild laxative (prune juice, Milk of Magnesia, MiraLax, etc) should be taken according to package directions if there are no bowel movements after 48 hours.   5. Wash / shower every day.  You may shower over the skin glue which is water proof.  Continue to shower over incision(s) after the dressing is off. 6. Skin glue will flake off after about 2 weeks.  You may leave the incision open to air.  You may replace a dressing/Band-Aid to cover the incision for comfort if you wish.      7. ACTIVITIES  as tolerated:   a. You may resume regular (light) daily activities beginning the next day--such as daily self-care, walking, climbing stairs--gradually increasing activities as tolerated.  If you can walk 30 minutes without difficulty, it is safe to try more intense activity such as jogging, treadmill, bicycling, low-impact aerobics, swimming, etc. b. Save the most intensive and strenuous  activity for last such as sit-ups, heavy lifting, contact sports, etc  Refrain from any heavy lifting or straining until you are off narcotics for pain control.   c. DO NOT PUSH THROUGH PAIN.  Let pain be your guide: If it hurts to do something, don't do it.  Pain is your body warning you to avoid that activity for another week until the pain goes down. d. You may drive when you are no longer taking prescription pain medication, you can comfortably wear a seatbelt, and you can safely maneuver your car and apply brakes. e. Dennis Bast may have sexual intercourse when it is comfortable.  8. FOLLOW UP in our office a. Please call [facility] at [phone] to set up an appointment to see your surgeon in the office for a follow-up appointment approximately 2-3 weeks after your surgery. b. Make sure that you call for this appointment the day you arrive home to insure a convenient appointment time. 9. IF YOU HAVE DISABILITY OR FAMILY LEAVE FORMS, BRING THEM TO THE OFFICE FOR PROCESSING.  DO NOT GIVE THEM TO YOUR DOCTOR.   WHEN TO CALL us [phone]: 1. Poor pain control 2. Reactions / problems with new medications (rash/itching, nausea, etc)  3. Fever over 101.5 F (38.5 C) 4. Worsening swelling or bruising 5. Continued bleeding from incision. 6. Increased pain, redness, or drainage from the incision 7. Difficulty breathing / swallowing   The clinic staff is available to answer your questions during regular business hours (8:30am-5pm).  Please dont hesitate to call and ask to speak to one of our nurses for clinical concerns.   If you have a medical emergency, go to the nearest emergency room or call 911.  A surgeon from [facility] is always on call at the [facility], [place], [place], [place], [place]  [id number] ? MAIN: [phone] ? TOLL FREE: [phone] ?  FAX [phone] [email or web address]    Post Anesthesia Home Care  Instructions  Activity: Get plenty of rest for the remainder of the day. A responsible individual must stay with you for 24 hours following the procedure.  For the next 24 hours, DO NOT: -Drive a car -Paediatric nurse -Drink alcoholic beverages -Take any medication unless instructed by your physician -Make any legal decisions or sign important papers.  Meals: Start with liquid foods such as gelatin or soup. Progress to regular foods as tolerated. Avoid greasy, spicy, heavy foods. If nausea and/or vomiting occur, drink only clear liquids until the nausea and/or vomiting subsides. Call your physician if vomiting continues.  Special Instructions/Symptoms: Your throat may feel dry or sore from the anesthesia or the breathing tube placed in your throat during surgery. If this causes discomfort, gargle with warm salt water. The discomfort should disappear within 24 hours.  If you had a scopolamine patch placed behind your ear for the management of post- operative nausea and/or vomiting:  1. The medication in the patch is effective for 72 hours, after which it should be removed.  Wrap patch in a tissue and discard in the trash. Wash hands thoroughly with soap and  include dry mouth, dizziness or visual disturbances. 3. Avoid touching the patch. Wash your hands with soap and water after contact with the patch.      

## 2017-03-20 ENCOUNTER — Encounter (HOSPITAL_BASED_OUTPATIENT_CLINIC_OR_DEPARTMENT_OTHER): Payer: Self-pay | Admitting: Surgery

## 2017-07-28 ENCOUNTER — Other Ambulatory Visit: Payer: Self-pay | Admitting: Family Medicine

## 2017-07-28 DIAGNOSIS — R102 Pelvic and perineal pain: Secondary | ICD-10-CM

## 2017-08-06 ENCOUNTER — Other Ambulatory Visit: Payer: Medicare Other

## 2018-04-13 ENCOUNTER — Other Ambulatory Visit: Payer: Self-pay | Admitting: Family Medicine

## 2018-04-13 DIAGNOSIS — E0789 Other specified disorders of thyroid: Secondary | ICD-10-CM

## 2018-04-14 ENCOUNTER — Ambulatory Visit
Admission: RE | Admit: 2018-04-14 | Discharge: 2018-04-14 | Disposition: A | Discharge status: Home / Self Care | Payer: Medicare Other | Source: Ambulatory Visit | Attending: Family Medicine | Admitting: Family Medicine

## 2018-04-14 DIAGNOSIS — E0789 Other specified disorders of thyroid: Secondary | ICD-10-CM

## 2019-03-20 ENCOUNTER — Ambulatory Visit: Payer: Medicare Other

## 2019-08-13 ENCOUNTER — Emergency Department (HOSPITAL_COMMUNITY): Payer: Medicare Other

## 2019-08-13 ENCOUNTER — Other Ambulatory Visit: Payer: Self-pay

## 2019-08-13 ENCOUNTER — Encounter (HOSPITAL_COMMUNITY): Payer: Self-pay

## 2019-08-13 ENCOUNTER — Emergency Department (HOSPITAL_COMMUNITY)
Admission: EM | Admit: 2019-08-13 | Discharge: 2019-08-13 | Disposition: A | Discharge status: Home / Self Care | Payer: Medicare Other | Attending: Emergency Medicine | Admitting: Emergency Medicine

## 2019-08-13 DIAGNOSIS — Y929 Unspecified place or not applicable: Secondary | ICD-10-CM | POA: Diagnosis not present

## 2019-08-13 DIAGNOSIS — Y998 Other external cause status: Secondary | ICD-10-CM | POA: Diagnosis not present

## 2019-08-13 DIAGNOSIS — W010XXA Fall on same level from slipping, tripping and stumbling without subsequent striking against object, initial encounter: Secondary | ICD-10-CM | POA: Diagnosis not present

## 2019-08-13 DIAGNOSIS — Z7982 Long term (current) use of aspirin: Secondary | ICD-10-CM | POA: Insufficient documentation

## 2019-08-13 DIAGNOSIS — S0083XA Contusion of other part of head, initial encounter: Secondary | ICD-10-CM | POA: Insufficient documentation

## 2019-08-13 DIAGNOSIS — E039 Hypothyroidism, unspecified: Secondary | ICD-10-CM | POA: Insufficient documentation

## 2019-08-13 DIAGNOSIS — Y93K1 Activity, walking an animal: Secondary | ICD-10-CM | POA: Diagnosis not present

## 2019-08-13 DIAGNOSIS — S4291XA Fracture of right shoulder girdle, part unspecified, initial encounter for closed fracture: Secondary | ICD-10-CM | POA: Insufficient documentation

## 2019-08-13 DIAGNOSIS — W19XXXA Unspecified fall, initial encounter: Secondary | ICD-10-CM

## 2019-08-13 DIAGNOSIS — S4991XA Unspecified injury of right shoulder and upper arm, initial encounter: Secondary | ICD-10-CM | POA: Diagnosis present

## 2019-08-13 DIAGNOSIS — Z79899 Other long term (current) drug therapy: Secondary | ICD-10-CM | POA: Insufficient documentation

## 2019-08-13 NOTE — Progress Notes (Signed)
Orthopedic Tech Progress Note Patient Details:  Connie Diaz May 09, 1945 [id number]  Ortho Devices Type of Ortho Device: Arm sling Ortho Device/Splint Location: RUE Ortho Device/Splint Interventions: Application, Adjustment   Post Interventions Patient Tolerated: Well Instructions Provided: Adjustment of device   Jamani Eley E Maansi Wike 08/13/2019, 10:04 PM

## 2019-08-13 NOTE — ED Provider Notes (Signed)
[facility] EMERGENCY DEPARTMENT Provider Note   CSN: [id number] Arrival date & time: 08/13/19  2011     History Chief Complaint  Patient presents with  . Fall    Connie Diaz is a 74 y.o. female.  The history is provided by the patient.  Fall This is a new (Pt was walking into the house and her dog tripped her and she fell on the concrete mostly on her right arm) problem. The current episode started 1 to 2 hours ago. The problem occurs constantly. The problem has not changed since onset.Associated symptoms comments: Right shoulder pain.  [address] and hit her nose on the concrete and upper lip.  No LOC or head injury.  No neck pain.  Ambulating without difficulty.. The symptoms are aggravated by bending and twisting. Relieved by: being still and keeping the arm over the head. She has tried nothing for the symptoms. The treatment provided no relief.       Past Medical History:  Diagnosis Date  . GERD (gastroesophageal reflux disease)   . Hypothyroidism   . Osteopenia   . Subcutaneous cyst    Left shoulder  . Thyroid disease     There are no problems to display for this patient.   Past Surgical History:  Procedure Laterality Date  . BREAST ENHANCEMENT SURGERY    . BREAST IMPLANT EXCHANGE    . COLONOSCOPY    . KNEE ARTHROSCOPY W/ MENISCAL REPAIR Right 06/2016  . MASS EXCISION Left 03/19/2017   Procedure: EXCISION LEFT SHOULDER CYST;  Surgeon: Clovis Riley, MD;  Location: [facility];  Service: General;  Laterality: Left;  . UPPER GI ENDOSCOPY       OB History   No obstetric history on file.     No family history on file.  Social History   Tobacco Use  . Smoking status: Never Smoker  . Smokeless tobacco: Never Used  Vaping Use  . Vaping Use: Never used  Substance Use Topics  . Alcohol use: Yes    Comment: wine in the afternoon  . Drug use: No    Home Medications Prior to Admission medications   Medication Sig Start  Date End Date Taking? Authorizing Provider  acetaminophen (TYLENOL) 500 MG tablet Take 500 mg by mouth See admin instructions. Takes 1 tablet in the morning and 2 tablets at night   Yes [provider]  Ascorbic Acid (VITAMIN C) 1000 MG tablet Take 1,000 mg by mouth daily.   Yes [provider]  aspirin EC 81 MG tablet Take 81 mg by mouth daily.   Yes [provider]  Calcium Carbonate-Vitamin D 600-125 MG-UNIT TABS Take 1 tablet by mouth 2 (two) times daily.    Yes [provider]  clonazePAM (KLONOPIN) 1 MG tablet Take 1 mg by mouth 2 (two) times daily.   Yes [provider]  fexofenadine (ALLEGRA) 180 MG tablet Take 180 mg by mouth daily.   Yes [provider]  fluticasone (FLONASE) 50 MCG/ACT nasal spray Place 1 spray into the nose daily as needed for allergies or rhinitis.   Yes [provider]  hyoscyamine (ANASPAZ) 0.125 MG TBDP disintergrating tablet Place 0.125 mg under the tongue every 4 (four) hours as needed for bladder spasms or cramping.    Yes [provider]  Lactobacillus (PROBIOTIC ACIDOPHILUS PO) Take 1 capsule by mouth daily.   Yes [provider]  levothyroxine (SYNTHROID) 75 MCG tablet Take 75 mcg by  mouth daily before breakfast.   Yes [provider]  MAGNESIUM CARBONATE PO Take 1 tablet by mouth daily.   Yes [provider]  Multiple Vitamin (MULTIVITAMIN WITH MINERALS) TABS tablet Take 1 tablet by mouth daily.   Yes [provider]  Propylene Glycol (SYSTANE COMPLETE OP) Place 1 drop into both eyes daily as needed (for dry eyes).   Yes [provider]  rOPINIRole (REQUIP) 0.5 MG tablet Take 0.5 mg by mouth every evening.    Yes [provider]  sertraline (ZOLOFT) 50 MG tablet Take 50 mg by mouth daily.   Yes [provider]  traZODone (DESYREL) 50 MG tablet Take 25 mg by mouth at bedtime.    Yes [provider]  zinc gluconate 50 MG  tablet Take 50 mg by mouth daily.   Yes [provider]  ibuprofen (ADVIL,MOTRIN) 800 MG tablet Take 1 tablet (800 mg total) by mouth every 8 (eight) hours as needed for mild pain or moderate pain. Patient not taking: Reported on 08/13/2019 03/19/17   Clovis Riley, MD    Allergies    Codeine and Other  Review of Systems   Review of Systems  All other systems reviewed and are negative.   Physical Exam Updated Vital Signs BP (!) 158/77 (BP Location: Left Arm)   Pulse 63   Temp 97.6 F (36.4 C) (Oral)   Resp 18   SpO2 95%   Physical Exam Vitals and nursing note reviewed.  Constitutional:      General: She is not in acute distress.    Appearance: Normal appearance. She is well-developed and normal weight.  HENT:     Head: Normocephalic. Contusion present.      Mouth/Throat:     Mouth: Mucous membranes are moist.     Comments: No dental injury Eyes:     Pupils: Pupils are equal, round, and reactive to light.  Cardiovascular:     Rate and Rhythm: Normal rate and regular rhythm.     Heart sounds: Normal heart sounds. No murmur heard.  No friction rub.  Pulmonary:     Effort: Pulmonary effort is normal.     Breath sounds: Normal breath sounds. No wheezing or rales.  Abdominal:     General: Bowel sounds are normal. There is no distension.     Palpations: Abdomen is soft.     Tenderness: There is no abdominal tenderness. There is no guarding or rebound.  Musculoskeletal:        General: No tenderness. Normal range of motion.     Right lower leg: No edema.     Left lower leg: No edema.     Comments: Pt holding her right arm over the head.  Deformity noted but normal sensation in the right hand.  2+ radial pulse and 5/5 hand grip strength.  No signs of right elbow or wrist injury.  Skin:    General: Skin is warm and dry.     Findings: No rash.  Neurological:     General: No focal deficit present.     Mental Status: She is alert and oriented to person, place,  and time. Mental status is at baseline.     Cranial Nerves: No cranial nerve deficit.  Psychiatric:        Mood and Affect: Mood normal.        Behavior: Behavior normal.        Thought Content: Thought content normal.     ED Results /  Procedures / Treatments   Labs (all labs ordered are listed, but only abnormal results are displayed) Labs Reviewed - No data to display  EKG None  Radiology DG Shoulder Right  Result Date: 08/13/2019 CLINICAL DATA:  Right shoulder pain after fall. EXAM: RIGHT SHOULDER - 2+ VIEW COMPARISON:  None. FINDINGS: Anterior dislocation of proximal right humeral head is noted. No definite fracture is noted. Visualized ribs are unremarkable. IMPRESSION: Anterior dislocation of proximal right humeral head. Electronically Signed   By: Marijo Conception M.D.   On: 08/13/2019 20:49   DG Shoulder Right Portable  Result Date: 08/13/2019 CLINICAL DATA:  Dislocation EXAM: PORTABLE RIGHT SHOULDER COMPARISON:  Earlier same day FINDINGS: Right humeral head is now anatomic location with respect to the glenoid. No acute fracture. IMPRESSION: Anatomic alignment at the right glenohumeral joint. Electronically Signed   By: Macy Mis M.D.   On: 08/13/2019 22:07    Procedures Reduction of dislocation  Date/Time: 08/13/2019 11:07 PM Performed by: Blanchie Dessert, MD Authorized by: Blanchie Dessert, MD  Local anesthesia used: no  Anesthesia: Local anesthesia used: no  Sedation: Patient sedated: no  Patient tolerance: patient tolerated the procedure well with no immediate complications Comments: Pt was placed in the prone position and right arm hanging off the bed.  With gentle traction and slow extension/supination had reduction of dislocation.    (including critical care time)  Medications Ordered in ED Medications - No data to display  ED Course  I have reviewed the triage vital signs and the nursing notes.  Pertinent labs & imaging results that were  available during my care of the patient were reviewed by me and considered in my medical decision making (see chart for details).    MDM Rules/Calculators/A&P                          Patient presenting today after a fall where she tripped over her dog.  She landed on her right shoulder and has had severe pain in the shoulder since.  She denies any chest injury, loss of consciousness or head injury but did scrape her nose and upper lip.  She takes aspirin but no other anticoagulants.  She has no neck pain and is otherwise well-appearing.  Plain film shows a right anterior shoulder dislocation.  Patient is neurovascularly intact.  Patient prefers not to be sedated and would like to try a reduction awake.  She is calm and relaxed and placed in the prone position within several minutes of traction of the affected arm reduction was achieved.  Repeat images show reduced shoulder and patient was placed in immobilizer and given follow-up with orthopedics.  Low suspicion for intracranial injury or facial fractures at this time and do not feel that patient needs further imaging.   Final Clinical Impression(s) / ED Diagnoses Final diagnoses:  Fall, initial encounter  Traumatic closed displaced fracture of right shoulder with anterior dislocation, initial encounter  Facial contusion, initial encounter    Rx / DC Orders ED Discharge Orders    None       Blanchie Dessert, MD 08/13/19 2309

## 2019-08-13 NOTE — ED Triage Notes (Addendum)
Pt arrives to ED w/ c/o fall. Pt states she landed on her R shoulder and nose. Pt reports 9/10 R shoulder pain and states she "can't put it down".  Pt denies loc. AOx4, neuro intact.

## 2019-08-13 NOTE — ED Notes (Signed)
Ortho tech called for immobilizer

## 2019-08-13 NOTE — ED Notes (Signed)
Discharge instructions discussed with pt. Pt verbalized understanding. Pt stable and ambulatory. No signature pad available. 

## 2019-08-13 NOTE — Discharge Instructions (Signed)
Wear the sling for the next 1 week but you can start ranging your arm with small movements.  After 1 week you can stop sling and start doing more full range of motion exercises with your right arm.

## 2019-11-30 ENCOUNTER — Ambulatory Visit: Payer: Medicare Other | Attending: Internal Medicine

## 2019-11-30 DIAGNOSIS — Z23 Encounter for immunization: Secondary | ICD-10-CM

## 2019-11-30 NOTE — Progress Notes (Signed)
   Covid-19 Vaccination Clinic  Name:  BRANDY ZUBA    MRN: [id number] DOB: 01-01-1946  11/30/2019  Ms. Severs was observed post Covid-19 immunization for 15 minutes without incident. She was provided with Vaccine Information Sheet and instruction to access the V-Safe system.   Ms. Purdy was instructed to call 911 with any severe reactions post vaccine: Marland Kitchen Difficulty breathing  . Swelling of face and throat  . A fast heartbeat  . A bad rash all over body  . Dizziness and weakness

## 2019-12-03 ENCOUNTER — Other Ambulatory Visit: Payer: Self-pay | Admitting: Obstetrics and Gynecology

## 2019-12-03 DIAGNOSIS — Z1231 Encounter for screening mammogram for malignant neoplasm of breast: Secondary | ICD-10-CM

## 2020-01-12 ENCOUNTER — Other Ambulatory Visit: Payer: Self-pay

## 2020-01-12 ENCOUNTER — Ambulatory Visit
Admission: RE | Admit: 2020-01-12 | Discharge: 2020-01-12 | Disposition: A | Discharge status: Home / Self Care | Payer: Medicare Other | Source: Ambulatory Visit | Attending: Obstetrics and Gynecology | Admitting: Obstetrics and Gynecology

## 2020-01-12 DIAGNOSIS — Z1231 Encounter for screening mammogram for malignant neoplasm of breast: Secondary | ICD-10-CM

## 2020-03-07 DIAGNOSIS — E039 Hypothyroidism, unspecified: Secondary | ICD-10-CM | POA: Diagnosis not present

## 2020-03-27 DIAGNOSIS — G2581 Restless legs syndrome: Secondary | ICD-10-CM | POA: Diagnosis not present

## 2020-03-27 DIAGNOSIS — E039 Hypothyroidism, unspecified: Secondary | ICD-10-CM | POA: Diagnosis not present

## 2020-03-27 DIAGNOSIS — R5383 Other fatigue: Secondary | ICD-10-CM | POA: Diagnosis not present

## 2020-06-07 DIAGNOSIS — H40011 Open angle with borderline findings, low risk, right eye: Secondary | ICD-10-CM | POA: Diagnosis not present

## 2020-06-07 DIAGNOSIS — H401121 Primary open-angle glaucoma, left eye, mild stage: Secondary | ICD-10-CM | POA: Diagnosis not present

## 2020-06-16 ENCOUNTER — Other Ambulatory Visit: Payer: Self-pay

## 2020-06-16 ENCOUNTER — Emergency Department (HOSPITAL_COMMUNITY)
Admission: EM | Admit: 2020-06-16 | Discharge: 2020-06-16 | Disposition: A | Discharge status: Home / Self Care | Payer: Medicare Other | Attending: Emergency Medicine | Admitting: Emergency Medicine

## 2020-06-16 ENCOUNTER — Encounter (HOSPITAL_COMMUNITY): Payer: Self-pay | Admitting: *Deleted

## 2020-06-16 DIAGNOSIS — Z79899 Other long term (current) drug therapy: Secondary | ICD-10-CM | POA: Insufficient documentation

## 2020-06-16 DIAGNOSIS — E039 Hypothyroidism, unspecified: Secondary | ICD-10-CM | POA: Insufficient documentation

## 2020-06-16 DIAGNOSIS — K644 Residual hemorrhoidal skin tags: Secondary | ICD-10-CM | POA: Insufficient documentation

## 2020-06-16 DIAGNOSIS — Z7982 Long term (current) use of aspirin: Secondary | ICD-10-CM | POA: Diagnosis not present

## 2020-06-16 DIAGNOSIS — K625 Hemorrhage of anus and rectum: Secondary | ICD-10-CM

## 2020-06-16 HISTORY — DX: Irritable bowel syndrome, unspecified: K58.9

## 2020-06-16 LAB — COMPREHENSIVE METABOLIC PANEL
ALT: 12 U/L (ref 0–44)
AST: 17 U/L (ref 15–41)
Albumin: 3.1 g/dL — ABNORMAL LOW (ref 3.5–5.0)
Alkaline Phosphatase: 79 U/L (ref 38–126)
Anion gap: 7 (ref 5–15)
BUN: 13 mg/dL (ref 8–23)
CO2: 26 mmol/L (ref 22–32)
Calcium: 8.7 mg/dL — ABNORMAL LOW (ref 8.9–10.3)
Chloride: 104 mmol/L (ref 98–111)
Creatinine, Ser: 0.66 mg/dL (ref 0.44–1.00)
GFR, Estimated: >60 mL/min (ref >60)
Glucose, Bld: 109 mg/dL — ABNORMAL HIGH (ref 70–99)
Potassium: 3.8 mmol/L (ref 3.5–5.1)
Sodium: 137 mmol/L (ref 135–145)
Total Bilirubin: 0.5 mg/dL (ref 0.3–1.2)
Total Protein: 6.3 g/dL — ABNORMAL LOW (ref 6.5–8.1)

## 2020-06-16 LAB — CBC
HCT: 34 % — ABNORMAL LOW (ref 36.0–46.0)
Hemoglobin: 11.2 g/dL — ABNORMAL LOW (ref 12.0–15.0)
MCH: 31.7 pg (ref 26.0–34.0)
MCHC: 32.9 g/dL (ref 30.0–36.0)
MCV: 96.3 fL (ref 80.0–100.0)
Platelets: 406 10*3/uL — ABNORMAL HIGH (ref 150–400)
RBC: 3.53 MIL/uL — ABNORMAL LOW (ref 3.87–5.11)
RDW: 12 % (ref 11.5–15.5)
WBC: 9.7 10*3/uL (ref 4.0–10.5)
nRBC: 0 % (ref 0.0–0.2)

## 2020-06-16 LAB — TYPE AND SCREEN
ABO/RH(D): O POS
Antibody Screen: NEGATIVE

## 2020-06-16 NOTE — Discharge Instructions (Signed)
Follow-up with your normal doctor on Monday for a repeat hemoglobin check.  Also, call GI to schedule colonoscopy and follow-up appointment. Apply hemorrhoid cream per packaging instructions. Return to the emergency department if you become significantly lightheaded, develop chest pain or shortness of breath.  You also need to see a doctor sooner if your bleeding becomes significantly increased.

## 2020-06-16 NOTE — ED Triage Notes (Addendum)
Pt is here d/t bright bloody stool that started yesterday. HX of IBS. C/o Lower abd pain.

## 2020-06-16 NOTE — ED Notes (Signed)
Pt d/c home per MD order. Discharge summary reviewed with pt, pt verbalizes understanding. No s/s of acute distress noted at discharge. Discharged home with husband.

## 2020-06-16 NOTE — ED Provider Notes (Signed)
[facility] EMERGENCY DEPARTMENT Provider Note   CSN: [id number] Arrival date & time: 06/16/20  0749     History Chief Complaint  Patient presents with  . Blood In Stools    Connie Diaz is a 75 y.o. female.  HPI 75 year old female with history of IBS, GERD, hypothyroidism presents the emergency department for bright red blood per rectum.  This has been present since yesterday, has had 3 episodes.  States that it only happens when she has a bowel movement.  Does not think it is large-volume, but is unable to fully quantify the amount of blood.  Denies history of anything this previously.  Did start applying hemorrhoid ointment yesterday as she felt like she had hemorrhoids after a hard bowel movement last week.  Does not think that is helped yet.  Nothing makes it better, nothing makes it worse.  Does endorse abdominal pain, but is unchanged from chronic lower abdominal pain that has been present for the last 2 weeks.  No nausea or vomiting.  No fever or urinary changes.  Does feel lightheaded, but states this has been present for over a week since starting sertraline.  States she has had lightheadedness associated with sertraline before.  No chest pain or shortness of breath.  Last bloody bowel movement was earlier this morning.     Past Medical History:  Diagnosis Date  . GERD (gastroesophageal reflux disease)   . Hypothyroidism   . IBS (irritable bowel syndrome)   . Osteopenia   . Subcutaneous cyst    Left shoulder  . Thyroid disease     There are no problems to display for this patient.   Past Surgical History:  Procedure Laterality Date  . AUGMENTATION MAMMAPLASTY    . BREAST ENHANCEMENT SURGERY    . BREAST IMPLANT EXCHANGE    . COLONOSCOPY    . KNEE ARTHROSCOPY W/ MENISCAL REPAIR Right 06/2016  . MASS EXCISION Left 03/19/2017   Procedure: EXCISION LEFT SHOULDER CYST;  Surgeon: Clovis Riley, MD;  Location: [facility];  Service:  General;  Laterality: Left;  . UPPER GI ENDOSCOPY       OB History   No obstetric history on file.     Family History  Problem Relation Age of Onset  . Breast cancer Maternal Aunt     Social History   Tobacco Use  . Smoking status: Never Smoker  . Smokeless tobacco: Never Used  Vaping Use  . Vaping Use: Never used  Substance Use Topics  . Alcohol use: Yes    Comment: wine in the afternoon  . Drug use: No    Home Medications Prior to Admission medications   Medication Sig Start Date End Date Taking? Authorizing Provider  acetaminophen (TYLENOL) 500 MG tablet Take 500 mg by mouth See admin instructions. Takes 1 tablet in the morning and 2 tablets at night    [provider]  Ascorbic Acid (VITAMIN C) 1000 MG tablet Take 1,000 mg by mouth daily.    [provider]  aspirin EC 81 MG tablet Take 81 mg by mouth daily.    [provider]  Calcium Carbonate-Vitamin D 600-125 MG-UNIT TABS Take 1 tablet by mouth 2 (two) times daily.     [provider]  clonazePAM (KLONOPIN) 1 MG tablet Take 1 mg by mouth 2 (two) times daily.    [provider]  fexofenadine (ALLEGRA) 180 MG tablet Take 180 mg by mouth daily.  [provider]  fluticasone (FLONASE) 50 MCG/ACT nasal spray Place 1 spray into the nose daily as needed for allergies or rhinitis.    [provider]  hyoscyamine (ANASPAZ) 0.125 MG TBDP disintergrating tablet Place 0.125 mg under the tongue every 4 (four) hours as needed for bladder spasms or cramping.     [provider]  ibuprofen (ADVIL,MOTRIN) 800 MG tablet Take 1 tablet (800 mg total) by mouth every 8 (eight) hours as needed for mild pain or moderate pain. Patient not taking: Reported on 08/13/2019 03/19/17   Clovis Riley, MD  Lactobacillus (PROBIOTIC ACIDOPHILUS PO) Take 1 capsule by mouth daily.    [provider]  levothyroxine (SYNTHROID) 75 MCG tablet Take 75 mcg by mouth daily  before breakfast.    [provider]  MAGNESIUM CARBONATE PO Take 1 tablet by mouth daily.    [provider]  Multiple Vitamin (MULTIVITAMIN WITH MINERALS) TABS tablet Take 1 tablet by mouth daily.    [provider]  Propylene Glycol (SYSTANE COMPLETE OP) Place 1 drop into both eyes daily as needed (for dry eyes).    [provider]  rOPINIRole (REQUIP) 0.5 MG tablet Take 0.5 mg by mouth every evening.     [provider]  sertraline (ZOLOFT) 50 MG tablet Take 50 mg by mouth daily.    [provider]  traZODone (DESYREL) 50 MG tablet Take 25 mg by mouth at bedtime.     [provider]  zinc gluconate 50 MG tablet Take 50 mg by mouth daily.    [provider]    Allergies    Codeine and Other  Review of Systems   Review of Systems  Constitutional: Negative for chills and fever.  HENT: Negative for ear pain and sore throat.   Eyes: Negative for pain and visual disturbance.  Respiratory: Negative for cough and shortness of breath.   Cardiovascular: Negative for chest pain and palpitations.  Gastrointestinal: Positive for abdominal pain and anal bleeding. Negative for vomiting.  Genitourinary: Negative for dysuria and hematuria.  Musculoskeletal: Negative for arthralgias and back pain.  Skin: Negative for color change and rash.  Neurological: Positive for light-headedness. Negative for seizures and syncope.  All other systems reviewed and are negative.   Physical Exam Updated Vital Signs BP 102/64   Pulse 86   Temp 98.2 F (36.8 C) (Oral)   Resp (!) 21   Ht 5\' 3"  (1.6 m)   Wt 64.4 kg   SpO2 94%   BMI 25.15 kg/m   Physical Exam Vitals and nursing note reviewed. Exam conducted with a chaperone present.  Constitutional:      General: She is not in acute distress.    Appearance: She is well-developed.  HENT:     Head: Normocephalic and atraumatic.     Right Ear: External ear normal.     Left Ear:  External ear normal.     Mouth/Throat:     Mouth: Mucous membranes are moist.  Eyes:     Extraocular Movements: Extraocular movements intact.     Conjunctiva/sclera: Conjunctivae normal.  Cardiovascular:     Rate and Rhythm: Normal rate and regular rhythm.     Heart sounds: Normal heart sounds. No murmur heard.   Pulmonary:     Effort: Pulmonary effort is normal. No respiratory distress.     Breath sounds: Normal breath sounds.  Abdominal:     Palpations: Abdomen is soft.     Tenderness: There is  abdominal tenderness (suprapubic). There is no guarding or rebound.  Genitourinary:    Comments: Generalized perianal irritation associated with scant bloody material.  Large external hemorrhoid that is soft and mucosal colored to right of anus. Musculoskeletal:        General: Normal range of motion.     Cervical back: Neck supple.     Right lower leg: No edema.     Left lower leg: No edema.  Skin:    General: Skin is warm and dry.     Capillary Refill: Capillary refill takes less than 2 seconds.  Neurological:     General: No focal deficit present.     Mental Status: She is alert and oriented to person, place, and time.  Psychiatric:        Mood and Affect: Mood normal.        Behavior: Behavior normal.     ED Results / Procedures / Treatments   Labs (all labs ordered are listed, but only abnormal results are displayed) Labs Reviewed  COMPREHENSIVE METABOLIC PANEL - Abnormal; Notable for the following components:      Result Value   Glucose, Bld 109 (*)    Calcium 8.7 (*)    Total Protein 6.3 (*)    Albumin 3.1 (*)    All other components within normal limits  CBC - Abnormal; Notable for the following components:   RBC 3.53 (*)    Hemoglobin 11.2 (*)    HCT 34.0 (*)    Platelets 406 (*)    All other components within normal limits  POC OCCULT BLOOD, ED  TYPE AND SCREEN  ABO/RH    EKG None  Radiology No results found.  Procedures Procedures   Medications  Ordered in ED Medications - No data to display  ED Course  I have reviewed the triage vital signs and the nursing notes.  Pertinent labs & imaging results that were available during my care of the patient were reviewed by me and considered in my medical decision making (see chart for details).    MDM Rules/Calculators/A&P                          75 year old female presents with bright red blood per rectum associated with hemorrhoid.  Upon arrival, vital signs are stable.  Patient is not in acute distress.  Exam shows well-appearing female.  Abdomen is benign, has very mild suprapubic abdominal tenderness that she states is chronic.  Conjunctive is not significantly pale.  Has normal cardiovascular exam.  Presentation is most consistent with right red blood per rectum secondary to hemorrhoids.  Does not appear to be hemodynamically unstable from bleeding.  Does not report large volume blood loss.  Less consistent with diverticulitis, colon cancer, mesenteric ischemia, rectal trauma, or AVM.  CBC shows hemoglobin 11.2, but previous was 3 years ago at 12.7.  Suspect this is around patient's baseline as she is not significantly symptomatic.  CMP grossly unremarkable.  I discussed findings with patient.  Recommended hemorrhoid cream.  Recommended follow-up with PCP on Monday for repeat hemoglobin.  Referral placed with GI, patient provided the number as well.  She feels comfortable with this plan.  We talked about symptomatic management and return precautions.  Patient discharged in stable condition.   Final Clinical Impression(s) / ED Diagnoses Final diagnoses:  Rectal bleeding    Rx / DC Orders ED Discharge Orders         Ordered  Ambulatory referral to Gastroenterology        06/16/20 Diboll, [place], DO 06/16/20 1950    Lucrezia Starch, MD 06/21/20 [phone]

## 2020-06-20 DIAGNOSIS — E7800 Pure hypercholesterolemia, unspecified: Secondary | ICD-10-CM | POA: Diagnosis not present

## 2020-06-20 DIAGNOSIS — K580 Irritable bowel syndrome with diarrhea: Secondary | ICD-10-CM | POA: Diagnosis not present

## 2020-06-20 DIAGNOSIS — E039 Hypothyroidism, unspecified: Secondary | ICD-10-CM | POA: Diagnosis not present

## 2020-06-20 DIAGNOSIS — G4700 Insomnia, unspecified: Secondary | ICD-10-CM | POA: Diagnosis not present

## 2020-06-20 DIAGNOSIS — K625 Hemorrhage of anus and rectum: Secondary | ICD-10-CM | POA: Diagnosis not present

## 2020-07-02 ENCOUNTER — Inpatient Hospital Stay (HOSPITAL_BASED_OUTPATIENT_CLINIC_OR_DEPARTMENT_OTHER): Admission: RE | Admit: 2020-07-02 | Payer: Medicare Other | Source: Ambulatory Visit

## 2020-07-03 ENCOUNTER — Other Ambulatory Visit: Payer: Self-pay | Admitting: Gastroenterology

## 2020-07-03 DIAGNOSIS — K529 Noninfective gastroenteritis and colitis, unspecified: Secondary | ICD-10-CM | POA: Diagnosis not present

## 2020-07-03 DIAGNOSIS — R1030 Lower abdominal pain, unspecified: Secondary | ICD-10-CM

## 2020-07-03 DIAGNOSIS — D509 Iron deficiency anemia, unspecified: Secondary | ICD-10-CM | POA: Diagnosis not present

## 2020-07-03 DIAGNOSIS — K5790 Diverticulosis of intestine, part unspecified, without perforation or abscess without bleeding: Secondary | ICD-10-CM | POA: Diagnosis not present

## 2020-07-03 DIAGNOSIS — K649 Unspecified hemorrhoids: Secondary | ICD-10-CM | POA: Diagnosis not present

## 2020-07-05 ENCOUNTER — Ambulatory Visit
Admission: RE | Admit: 2020-07-05 | Discharge: 2020-07-05 | Disposition: A | Discharge status: Home / Self Care | Payer: Medicare Other | Source: Ambulatory Visit | Attending: Gastroenterology | Admitting: Gastroenterology

## 2020-07-05 DIAGNOSIS — K529 Noninfective gastroenteritis and colitis, unspecified: Secondary | ICD-10-CM | POA: Diagnosis not present

## 2020-07-05 DIAGNOSIS — K5750 Diverticulosis of both small and large intestine without perforation or abscess without bleeding: Secondary | ICD-10-CM | POA: Diagnosis not present

## 2020-07-05 DIAGNOSIS — R1030 Lower abdominal pain, unspecified: Secondary | ICD-10-CM

## 2020-07-05 MED ORDER — IOPAMIDOL (ISOVUE-300) INJECTION 61%
100.0000 mL | Freq: Once | INTRAVENOUS | Status: AC | PRN
  Administered 2020-07-05: 100 mL via INTRAVENOUS

## 2020-07-06 DIAGNOSIS — K644 Residual hemorrhoidal skin tags: Secondary | ICD-10-CM | POA: Diagnosis not present

## 2020-09-05 DIAGNOSIS — K5289 Other specified noninfective gastroenteritis and colitis: Secondary | ICD-10-CM | POA: Diagnosis not present

## 2020-09-05 DIAGNOSIS — K633 Ulcer of intestine: Secondary | ICD-10-CM | POA: Diagnosis not present

## 2020-09-05 DIAGNOSIS — D123 Benign neoplasm of transverse colon: Secondary | ICD-10-CM | POA: Diagnosis not present

## 2020-09-05 DIAGNOSIS — D509 Iron deficiency anemia, unspecified: Secondary | ICD-10-CM | POA: Diagnosis not present

## 2020-09-05 DIAGNOSIS — R197 Diarrhea, unspecified: Secondary | ICD-10-CM | POA: Diagnosis not present

## 2020-09-05 DIAGNOSIS — R1013 Epigastric pain: Secondary | ICD-10-CM | POA: Diagnosis not present

## 2020-09-08 DIAGNOSIS — K5289 Other specified noninfective gastroenteritis and colitis: Secondary | ICD-10-CM | POA: Diagnosis not present

## 2020-10-04 DIAGNOSIS — K5289 Other specified noninfective gastroenteritis and colitis: Secondary | ICD-10-CM | POA: Diagnosis not present

## 2020-10-04 DIAGNOSIS — R634 Abnormal weight loss: Secondary | ICD-10-CM | POA: Diagnosis not present

## 2020-10-04 DIAGNOSIS — R197 Diarrhea, unspecified: Secondary | ICD-10-CM | POA: Diagnosis not present

## 2020-11-10 DIAGNOSIS — R197 Diarrhea, unspecified: Secondary | ICD-10-CM | POA: Diagnosis not present

## 2020-11-10 DIAGNOSIS — R1030 Lower abdominal pain, unspecified: Secondary | ICD-10-CM | POA: Diagnosis not present

## 2020-11-23 DIAGNOSIS — K625 Hemorrhage of anus and rectum: Secondary | ICD-10-CM | POA: Diagnosis not present

## 2020-11-24 ENCOUNTER — Other Ambulatory Visit: Payer: Self-pay

## 2020-11-24 ENCOUNTER — Ambulatory Visit
Admission: RE | Admit: 2020-11-24 | Discharge: 2020-11-24 | Disposition: A | Discharge status: Home / Self Care | Payer: Medicare Other | Source: Ambulatory Visit | Attending: Gastroenterology | Admitting: Gastroenterology

## 2020-11-24 ENCOUNTER — Other Ambulatory Visit: Payer: Self-pay | Admitting: Gastroenterology

## 2020-11-24 DIAGNOSIS — D72829 Elevated white blood cell count, unspecified: Secondary | ICD-10-CM

## 2020-11-24 DIAGNOSIS — Q433 Congenital malformations of intestinal fixation: Secondary | ICD-10-CM | POA: Diagnosis not present

## 2020-11-24 DIAGNOSIS — R109 Unspecified abdominal pain: Secondary | ICD-10-CM

## 2020-11-24 DIAGNOSIS — K6389 Other specified diseases of intestine: Secondary | ICD-10-CM | POA: Diagnosis not present

## 2020-11-24 DIAGNOSIS — R197 Diarrhea, unspecified: Secondary | ICD-10-CM | POA: Diagnosis not present

## 2020-11-24 MED ORDER — IOPAMIDOL (ISOVUE-300) INJECTION 61%
100.0000 mL | Freq: Once | INTRAVENOUS | Status: AC | PRN
  Administered 2020-11-24: 100 mL via INTRAVENOUS

## 2020-11-28 DIAGNOSIS — K625 Hemorrhage of anus and rectum: Secondary | ICD-10-CM | POA: Diagnosis not present

## 2020-11-28 DIAGNOSIS — R109 Unspecified abdominal pain: Secondary | ICD-10-CM | POA: Diagnosis not present

## 2020-12-19 DIAGNOSIS — K5289 Other specified noninfective gastroenteritis and colitis: Secondary | ICD-10-CM | POA: Diagnosis not present

## 2020-12-19 DIAGNOSIS — K648 Other hemorrhoids: Secondary | ICD-10-CM | POA: Diagnosis not present

## 2020-12-19 DIAGNOSIS — R197 Diarrhea, unspecified: Secondary | ICD-10-CM | POA: Diagnosis not present

## 2020-12-25 DIAGNOSIS — K5289 Other specified noninfective gastroenteritis and colitis: Secondary | ICD-10-CM | POA: Diagnosis not present

## 2020-12-26 DIAGNOSIS — G4700 Insomnia, unspecified: Secondary | ICD-10-CM | POA: Diagnosis not present

## 2020-12-26 DIAGNOSIS — E7800 Pure hypercholesterolemia, unspecified: Secondary | ICD-10-CM | POA: Diagnosis not present

## 2020-12-26 DIAGNOSIS — M85852 Other specified disorders of bone density and structure, left thigh: Secondary | ICD-10-CM | POA: Diagnosis not present

## 2020-12-26 DIAGNOSIS — Z0000 Encounter for general adult medical examination without abnormal findings: Secondary | ICD-10-CM | POA: Diagnosis not present

## 2020-12-26 DIAGNOSIS — D509 Iron deficiency anemia, unspecified: Secondary | ICD-10-CM | POA: Diagnosis not present

## 2020-12-26 DIAGNOSIS — Z23 Encounter for immunization: Secondary | ICD-10-CM | POA: Diagnosis not present

## 2020-12-26 DIAGNOSIS — R7309 Other abnormal glucose: Secondary | ICD-10-CM | POA: Diagnosis not present

## 2020-12-26 DIAGNOSIS — M542 Cervicalgia: Secondary | ICD-10-CM | POA: Diagnosis not present

## 2020-12-26 DIAGNOSIS — R739 Hyperglycemia, unspecified: Secondary | ICD-10-CM | POA: Diagnosis not present

## 2020-12-26 DIAGNOSIS — E039 Hypothyroidism, unspecified: Secondary | ICD-10-CM | POA: Diagnosis not present

## 2020-12-26 DIAGNOSIS — K529 Noninfective gastroenteritis and colitis, unspecified: Secondary | ICD-10-CM | POA: Diagnosis not present

## 2020-12-29 ENCOUNTER — Other Ambulatory Visit: Payer: Self-pay | Admitting: Family Medicine

## 2021-01-01 ENCOUNTER — Other Ambulatory Visit: Payer: Self-pay | Admitting: Family Medicine

## 2021-01-01 DIAGNOSIS — M8580 Other specified disorders of bone density and structure, unspecified site: Secondary | ICD-10-CM

## 2021-01-09 ENCOUNTER — Other Ambulatory Visit: Payer: Self-pay | Admitting: Gastroenterology

## 2021-01-09 DIAGNOSIS — K633 Ulcer of intestine: Secondary | ICD-10-CM

## 2021-01-09 DIAGNOSIS — K529 Noninfective gastroenteritis and colitis, unspecified: Secondary | ICD-10-CM

## 2021-01-09 DIAGNOSIS — R109 Unspecified abdominal pain: Secondary | ICD-10-CM

## 2021-01-11 ENCOUNTER — Other Ambulatory Visit: Payer: Self-pay | Admitting: Family Medicine

## 2021-01-11 DIAGNOSIS — M8580 Other specified disorders of bone density and structure, unspecified site: Secondary | ICD-10-CM

## 2021-01-31 DIAGNOSIS — D509 Iron deficiency anemia, unspecified: Secondary | ICD-10-CM | POA: Diagnosis not present

## 2021-01-31 DIAGNOSIS — Z79899 Other long term (current) drug therapy: Secondary | ICD-10-CM | POA: Diagnosis not present

## 2021-02-05 ENCOUNTER — Ambulatory Visit
Admission: RE | Admit: 2021-02-05 | Discharge: 2021-02-05 | Disposition: A | Discharge status: Home / Self Care | Payer: Medicare Other | Source: Ambulatory Visit | Attending: Gastroenterology | Admitting: Gastroenterology

## 2021-02-05 ENCOUNTER — Other Ambulatory Visit: Payer: Self-pay

## 2021-02-05 DIAGNOSIS — I700 Atherosclerosis of aorta: Secondary | ICD-10-CM | POA: Diagnosis not present

## 2021-02-05 DIAGNOSIS — K5730 Diverticulosis of large intestine without perforation or abscess without bleeding: Secondary | ICD-10-CM | POA: Diagnosis not present

## 2021-02-05 DIAGNOSIS — K529 Noninfective gastroenteritis and colitis, unspecified: Secondary | ICD-10-CM

## 2021-02-05 DIAGNOSIS — K633 Ulcer of intestine: Secondary | ICD-10-CM

## 2021-02-05 DIAGNOSIS — R109 Unspecified abdominal pain: Secondary | ICD-10-CM

## 2021-02-05 MED ORDER — IOPAMIDOL (ISOVUE-370) INJECTION 76%
75.0000 mL | Freq: Once | INTRAVENOUS | Status: AC | PRN
  Administered 2021-02-05: 75 mL via INTRAVENOUS

## 2021-02-14 ENCOUNTER — Ambulatory Visit: Payer: Medicare Other

## 2021-02-19 DIAGNOSIS — K529 Noninfective gastroenteritis and colitis, unspecified: Secondary | ICD-10-CM | POA: Diagnosis not present

## 2021-02-22 DIAGNOSIS — H401121 Primary open-angle glaucoma, left eye, mild stage: Secondary | ICD-10-CM | POA: Diagnosis not present

## 2021-02-22 DIAGNOSIS — H40011 Open angle with borderline findings, low risk, right eye: Secondary | ICD-10-CM | POA: Diagnosis not present

## 2021-02-28 ENCOUNTER — Ambulatory Visit
Admission: RE | Admit: 2021-02-28 | Discharge: 2021-02-28 | Disposition: A | Discharge status: Home / Self Care | Payer: Medicare Other | Source: Ambulatory Visit | Attending: Family Medicine | Admitting: Family Medicine

## 2021-02-28 DIAGNOSIS — Z1231 Encounter for screening mammogram for malignant neoplasm of breast: Secondary | ICD-10-CM | POA: Diagnosis not present

## 2021-02-28 DIAGNOSIS — M8580 Other specified disorders of bone density and structure, unspecified site: Secondary | ICD-10-CM

## 2021-05-05 ENCOUNTER — Emergency Department (HOSPITAL_BASED_OUTPATIENT_CLINIC_OR_DEPARTMENT_OTHER)
Admission: EM | Admit: 2021-05-05 | Discharge: 2021-05-05 | Disposition: A | Discharge status: Home / Self Care | Payer: Medicare Other | Attending: Emergency Medicine | Admitting: Emergency Medicine

## 2021-05-05 ENCOUNTER — Other Ambulatory Visit: Payer: Self-pay

## 2021-05-05 ENCOUNTER — Encounter (HOSPITAL_BASED_OUTPATIENT_CLINIC_OR_DEPARTMENT_OTHER): Payer: Self-pay | Admitting: Emergency Medicine

## 2021-05-05 DIAGNOSIS — U071 COVID-19: Secondary | ICD-10-CM | POA: Insufficient documentation

## 2021-05-05 DIAGNOSIS — R509 Fever, unspecified: Secondary | ICD-10-CM | POA: Diagnosis present

## 2021-05-05 DIAGNOSIS — Z7982 Long term (current) use of aspirin: Secondary | ICD-10-CM | POA: Diagnosis not present

## 2021-05-05 LAB — BASIC METABOLIC PANEL
Anion gap: 9 (ref 5–15)
BUN: 10 mg/dL (ref 8–23)
CO2: 27 mmol/L (ref 22–32)
Calcium: 9.7 mg/dL (ref 8.9–10.3)
Chloride: 100 mmol/L (ref 98–111)
Creatinine, Ser: 0.67 mg/dL (ref 0.44–1.00)
GFR, Estimated: >60 mL/min (ref >60)
Glucose, Bld: 105 mg/dL — ABNORMAL HIGH (ref 70–99)
Potassium: 3.7 mmol/L (ref 3.5–5.1)
Sodium: 136 mmol/L (ref 135–145)

## 2021-05-05 LAB — RESP PANEL BY RT-PCR (FLU A&B, COVID) ARPGX2
Influenza A by PCR: NEGATIVE
Influenza B by PCR: NEGATIVE
SARS Coronavirus 2 by RT PCR: POSITIVE — AB

## 2021-05-05 MED ORDER — ACETAMINOPHEN 325 MG PO TABS
650.0000 mg | ORAL_TABLET | Freq: Once | ORAL | Status: AC
  Administered 2021-05-05: 650 mg via ORAL
  Filled 2021-05-05: qty 2

## 2021-05-05 MED ORDER — NIRMATRELVIR/RITONAVIR (PAXLOVID)TABLET: 3.0000 | ORAL_TABLET | Freq: Two times a day (BID) | ORAL | 0 refills | Status: AC

## 2021-05-05 NOTE — ED Notes (Signed)
EMT-P provided AVS using Teachback Method. Patient verbalizes understanding of Discharge Instructions. Opportunity for Questioning and Answers were provided by EMT-P. Patient Discharged from ED.  ? ?

## 2021-05-05 NOTE — ED Provider Notes (Signed)
?[facility] EMERGENC[facility] DEPT ?Provider Note ? ? ?CSN: [id number] ?Arrival date & time: 05/05/21  9211 ? ?  ? ?History ? ?Chief Complaint  ?Patient presents with  ? Fever  ? ? ?Connie Diaz is a 76 y.o. female. ? ?Patient is a 76 year old female presenting for headache.  Patient admits to generalized feelings of unwellness, headache, and sinus pressure that developed yesterday.  Tmax of 101.8.  Granddaughter at home is also feeling unwell.  Denies chest pain or shortness of breath.  Denies nausea, vomiting, diarrhea. ? ?The history is provided by the patient. No language interpreter was used.  ?Fever ?Associated symptoms: headaches   ?Associated symptoms: no chest pain, no chills, no cough, no dysuria, no ear pain, no rash, no sore throat and no vomiting   ? ?  ? ?Home Medications ?Prior to Admission medications   ?Medication Sig Start Date End Date Taking? Authorizing Provider  ?nirmatrelvir/ritonavir EUA (PAXLOVID) 20 x 150 MG & 10 x '100MG'$  TABS Take 3 tablets by mouth 2 (two) times daily for 5 days. Patient GFR is >60. ?Take nirmatrelvir (150 mg) two tablets twice daily for 5 days and ritonavir (100 mg) one tablet twice daily for 5 days. 05/05/21 9/41/74 Yes Campbell Stall P, DO  ?acetaminophen (TYLENOL) 500 MG tablet Take 500 mg by mouth See admin instructions. Takes 1 tablet in the morning and 2 tablets at night    [provider]  ?Ascorbic Acid (VITAMIN C) 1000 MG tablet Take 1,000 mg by mouth daily.    [provider]  ?aspirin EC 81 MG tablet Take 81 mg by mouth daily.    [provider]  ?Calcium Carbonate-Vitamin D 600-125 MG-UNIT TABS Take 1 tablet by mouth 2 (two) times daily.     [provider]  ?clonazePAM (KLONOPIN) 1 MG tablet Take 1 mg by mouth 2 (two) times daily.    [provider]  ?fexofenadine (ALLEGRA) 180 MG tablet Take 180 mg by mouth daily.    [provider]  ?fluticasone (FLONASE) 50 MCG/ACT nasal spray Place 1 spray into the  nose daily as needed for allergies or rhinitis.    [provider]  ?hyoscyamine (ANASPAZ) 0.125 MG TBDP disintergrating tablet Place 0.125 mg under the tongue every 4 (four) hours as needed for bladder spasms or cramping.     [provider]  ?ibuprofen (ADVIL,MOTRIN) 800 MG tablet Take 1 tablet (800 mg total) by mouth every 8 (eight) hours as needed for mild pain or moderate pain. ?Patient not taking: Reported on 08/13/2019 03/19/17   Clovis Riley, MD  ?Lactobacillus (PROBIOTIC ACIDOPHILUS PO) Take 1 capsule by mouth daily.    [provider]  ?levothyroxine (SYNTHROID) 75 MCG tablet Take 75 mcg by mouth daily before breakfast.    [provider]  ?MAGNESIUM CARBONATE PO Take 1 tablet by mouth daily.    [provider]  ?Multiple Vitamin (MULTIVITAMIN WITH MINERALS) TABS tablet Take 1 tablet by mouth daily.    [provider]  ?Propylene Glycol (SYSTANE COMPLETE OP) Place 1 drop into both eyes daily as needed (for dry eyes).    [provider]  ?rOPINIRole (REQUIP) 0.5 MG tablet Take 0.5 mg by mouth every evening.     [provider]  ?sertraline (ZOLOFT) 50 MG tablet Take 50 mg by mouth daily.    [provider]  ?traZODone (DESYREL) 50 MG tablet Take 25 mg by mouth at bedtime.     [provider]  ?  zinc gluconate 50 MG tablet Take 50 mg by mouth daily.    [provider]  ?   ? ?Allergies    ?Codeine and Other   ? ?Review of Systems   ?Review of Systems  ?Constitutional:  Positive for fever. Negative for chills.  ?HENT:  Negative for ear pain and sore throat.   ?Eyes:  Negative for pain and visual disturbance.  ?Respiratory:  Negative for cough and shortness of breath.   ?Cardiovascular:  Negative for chest pain and palpitations.  ?Gastrointestinal:  Negative for abdominal pain and vomiting.  ?Genitourinary:  Negative for dysuria and hematuria.  ?Musculoskeletal:  Negative for arthralgias and back pain.  ?Skin:   Negative for color change and rash.  ?Neurological:  Positive for headaches. Negative for seizures and syncope.  ?All other systems reviewed and are negative. ? ?Physical Exam ?Updated Vital Signs ?BP (!) 147/75   Pulse 86   Temp 100 ?F (37.8 ?C)   Resp 16   SpO2 97%  ?Physical Exam ?Vitals and nursing note reviewed.  ?Constitutional:   ?   General: She is not in acute distress. ?   Appearance: She is well-developed.  ?HENT:  ?   Head: Normocephalic and atraumatic.  ?Eyes:  ?   Conjunctiva/sclera: Conjunctivae normal.  ?Cardiovascular:  ?   Rate and Rhythm: Normal rate and regular rhythm.  ?   Heart sounds: No murmur heard. ?Pulmonary:  ?   Effort: Pulmonary effort is normal. No respiratory distress.  ?   Breath sounds: Normal breath sounds.  ?Abdominal:  ?   Palpations: Abdomen is soft.  ?   Tenderness: There is no abdominal tenderness.  ?Musculoskeletal:     ?   General: No swelling.  ?   Cervical back: Neck supple.  ?Skin: ?   General: Skin is warm and dry.  ?   Capillary Refill: Capillary refill takes less than 2 seconds.  ?Neurological:  ?   Mental Status: She is alert.  ?Psychiatric:     ?   Mood and Affect: Mood normal.  ? ? ?ED Results / Procedures / Treatments   ?Labs ?(all labs ordered are listed, but only abnormal results are displayed) ?Labs Reviewed  ?RESP PANEL BY RT-PCR (FLU A&B, COVID) ARPGX2 - Abnormal; Notable for the following components:  ?    Result Value  ? SARS Coronavirus 2 by RT PCR POSITIVE (*)   ? All other components within normal limits  ?BASIC METABOLIC PANEL - Abnormal; Notable for the following components:  ? Glucose, Bld 105 (*)   ? All other components within normal limits  ? ? ?EKG ?None ? ?Radiology ?No results found. ? ?Procedures ?Procedures  ? ? ?Medications Ordered in ED ?Medications  ?acetaminophen (TYLENOL) tablet 650 mg (650 mg Oral Given 05/05/21 1349)  ? ? ?ED Course/ Medical Decision Making/ A&P ?  ?                        ?Medical Decision Making ?Amount and/or  Complexity of Data Reviewed ?Labs: ordered. ? ?Risk ?OTC drugs. ? ? ?2:85 AM ?76 year old female presenting for sinus congestion, fever, and headache.  Patient is alert and oriented x3, no acute distress, temperature of 100 ?F, with stable vital signs.  Physical exam demonstrates no neck stiffness or neck rigidity.  Patient positive for COVID-19 virus.  No hypoxia.  Stable renal function. Paxlovid sent to pharmacy.  Recommendations for rest, increase oral hydration, Motrin and Tylenol for  body aches and fever. ? ?Patient in no distress and overall condition improved here in the ED. Detailed discussions were had with the patient regarding current findings, and need for close f/u with PCP or on call doctor. The patient has been instructed to return immediately if the symptoms worsen in any way for re-evaluation. Patient verbalized understanding and is in agreement with current care plan. All questions answered prior to discharge. ? ? ? ? ? ? ? ? ?Final Clinical Impression(s) / ED Diagnoses ?Final diagnoses:  ?COVID  ? ? ?Rx / DC Orders ?ED Discharge Orders   ? ?      Ordered  ?  nirmatrelvir/ritonavir EUA (PAXLOVID) 20 x 150 MG & 10 x '100MG'$  TABS  2 times daily       ? 05/05/21 1449  ? ?  ?  ? ?  ? ? ?  ?Lianne Cure, DO ?22[birth year] [phone] ? ?

## 2021-05-05 NOTE — ED Triage Notes (Signed)
Headache starting last night , fever 101.8. tylenol helped fever. Pt''s daughter had a sinus infeciton and she feels similar.  ?

## 2021-05-14 DIAGNOSIS — R140 Abdominal distension (gaseous): Secondary | ICD-10-CM | POA: Diagnosis not present

## 2021-05-29 DIAGNOSIS — R140 Abdominal distension (gaseous): Secondary | ICD-10-CM | POA: Diagnosis not present

## 2021-06-07 DIAGNOSIS — D509 Iron deficiency anemia, unspecified: Secondary | ICD-10-CM | POA: Diagnosis not present

## 2021-06-08 ENCOUNTER — Inpatient Hospital Stay: Admission: RE | Admit: 2021-06-08 | Payer: Medicare Other | Source: Ambulatory Visit

## 2021-07-25 DIAGNOSIS — M85851 Other specified disorders of bone density and structure, right thigh: Secondary | ICD-10-CM | POA: Diagnosis not present

## 2021-07-25 DIAGNOSIS — M85852 Other specified disorders of bone density and structure, left thigh: Secondary | ICD-10-CM | POA: Diagnosis not present

## 2021-07-25 DIAGNOSIS — Z780 Asymptomatic menopausal state: Secondary | ICD-10-CM | POA: Diagnosis not present

## 2021-08-21 DIAGNOSIS — K529 Noninfective gastroenteritis and colitis, unspecified: Secondary | ICD-10-CM | POA: Diagnosis not present

## 2021-08-23 DIAGNOSIS — H401121 Primary open-angle glaucoma, left eye, mild stage: Secondary | ICD-10-CM | POA: Diagnosis not present

## 2021-08-23 DIAGNOSIS — H40011 Open angle with borderline findings, low risk, right eye: Secondary | ICD-10-CM | POA: Diagnosis not present

## 2021-08-23 DIAGNOSIS — H5213 Myopia, bilateral: Secondary | ICD-10-CM | POA: Diagnosis not present

## 2021-10-30 DIAGNOSIS — M25561 Pain in right knee: Secondary | ICD-10-CM | POA: Diagnosis not present

## 2021-12-03 DIAGNOSIS — D509 Iron deficiency anemia, unspecified: Secondary | ICD-10-CM | POA: Diagnosis not present

## 2021-12-03 DIAGNOSIS — Z0000 Encounter for general adult medical examination without abnormal findings: Secondary | ICD-10-CM | POA: Diagnosis not present

## 2021-12-03 DIAGNOSIS — F5101 Primary insomnia: Secondary | ICD-10-CM | POA: Diagnosis not present

## 2021-12-03 DIAGNOSIS — E039 Hypothyroidism, unspecified: Secondary | ICD-10-CM | POA: Diagnosis not present

## 2021-12-03 DIAGNOSIS — E7800 Pure hypercholesterolemia, unspecified: Secondary | ICD-10-CM | POA: Diagnosis not present

## 2021-12-03 DIAGNOSIS — Z23 Encounter for immunization: Secondary | ICD-10-CM | POA: Diagnosis not present

## 2022-02-14 DIAGNOSIS — Z961 Presence of intraocular lens: Secondary | ICD-10-CM | POA: Diagnosis not present

## 2022-02-14 DIAGNOSIS — H40011 Open angle with borderline findings, low risk, right eye: Secondary | ICD-10-CM | POA: Diagnosis not present

## 2022-02-14 DIAGNOSIS — H401121 Primary open-angle glaucoma, left eye, mild stage: Secondary | ICD-10-CM | POA: Diagnosis not present

## 2022-02-20 DIAGNOSIS — K529 Noninfective gastroenteritis and colitis, unspecified: Secondary | ICD-10-CM | POA: Diagnosis not present

## 2022-02-21 DIAGNOSIS — K5140 Inflammatory polyps of colon without complications: Secondary | ICD-10-CM | POA: Diagnosis not present

## 2022-02-21 DIAGNOSIS — K5730 Diverticulosis of large intestine without perforation or abscess without bleeding: Secondary | ICD-10-CM | POA: Diagnosis not present

## 2022-02-21 DIAGNOSIS — K529 Noninfective gastroenteritis and colitis, unspecified: Secondary | ICD-10-CM | POA: Diagnosis not present

## 2022-02-21 DIAGNOSIS — Q438 Other specified congenital malformations of intestine: Secondary | ICD-10-CM | POA: Diagnosis not present

## 2022-02-21 DIAGNOSIS — K644 Residual hemorrhoidal skin tags: Secondary | ICD-10-CM | POA: Diagnosis not present

## 2022-02-21 DIAGNOSIS — K648 Other hemorrhoids: Secondary | ICD-10-CM | POA: Diagnosis not present

## 2022-02-25 DIAGNOSIS — K529 Noninfective gastroenteritis and colitis, unspecified: Secondary | ICD-10-CM | POA: Diagnosis not present

## 2022-02-25 DIAGNOSIS — K5140 Inflammatory polyps of colon without complications: Secondary | ICD-10-CM | POA: Diagnosis not present

## 2022-03-12 DIAGNOSIS — K529 Noninfective gastroenteritis and colitis, unspecified: Secondary | ICD-10-CM | POA: Diagnosis not present

## 2022-04-08 DIAGNOSIS — J209 Acute bronchitis, unspecified: Secondary | ICD-10-CM | POA: Diagnosis not present

## 2022-05-27 DIAGNOSIS — R140 Abdominal distension (gaseous): Secondary | ICD-10-CM | POA: Diagnosis not present

## 2022-08-07 DIAGNOSIS — Z961 Presence of intraocular lens: Secondary | ICD-10-CM | POA: Diagnosis not present

## 2022-08-07 DIAGNOSIS — H40011 Open angle with borderline findings, low risk, right eye: Secondary | ICD-10-CM | POA: Diagnosis not present

## 2022-08-07 DIAGNOSIS — H401121 Primary open-angle glaucoma, left eye, mild stage: Secondary | ICD-10-CM | POA: Diagnosis not present

## 2022-08-11 IMAGING — CT CT ABD-PELV W/ CM
1 of 3 series · 12 of 32 positions shown, 18 images · IV contrast (APPLIED)
Comparison: 07/05/2020

CLINICAL DATA: Abdominal pain, leukocytosis, pelvic pain, diarrhea,
possible ulcerative colitis or Crohn's

EXAM:
CT ABDOMEN AND PELVIS WITH CONTRAST
TECHNIQUE: Multidetector CT imaging of the abdomen and pelvis was performed
using the standard protocol following bolus administration of
intravenous contrast.
CONTRAST:  100mL 7SEWBJ-SRR IOPAMIDOL (7SEWBJ-SRR) INJECTION 61%

[Series 2: abd/pelvis w/cm · axial · 0.74mm/px · z∈[-396,-6]mm · 12 of 92 slices shown, 18 images]
[im 7/92  soft-tissue]
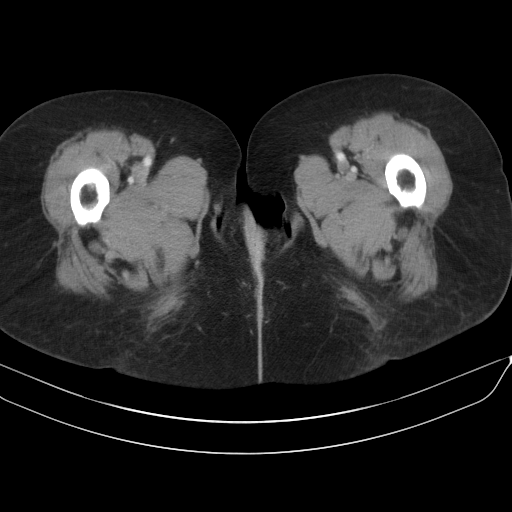
[im 7/92  bone]
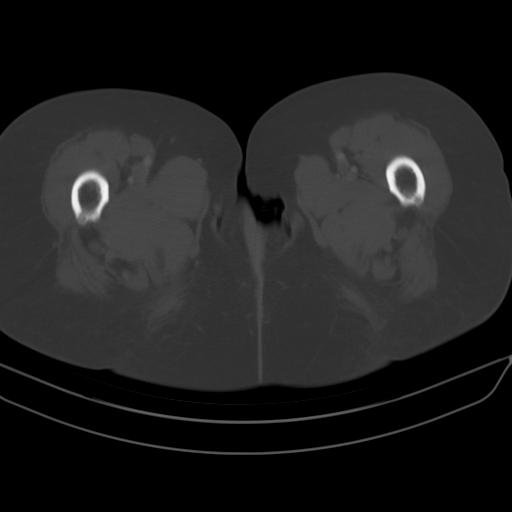
[im 14/92  soft-tissue]
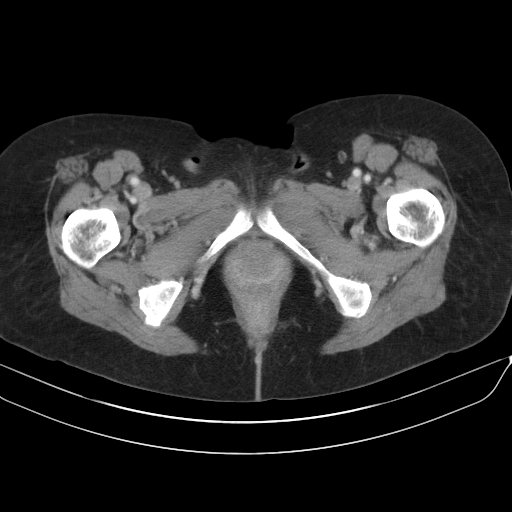
[im 20/92  soft-tissue]
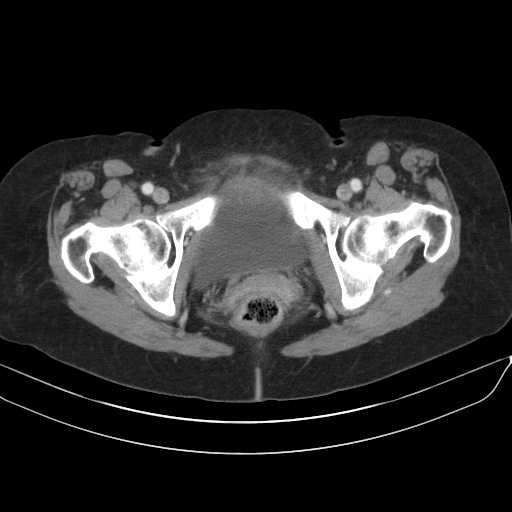
[im 27/92  soft-tissue]
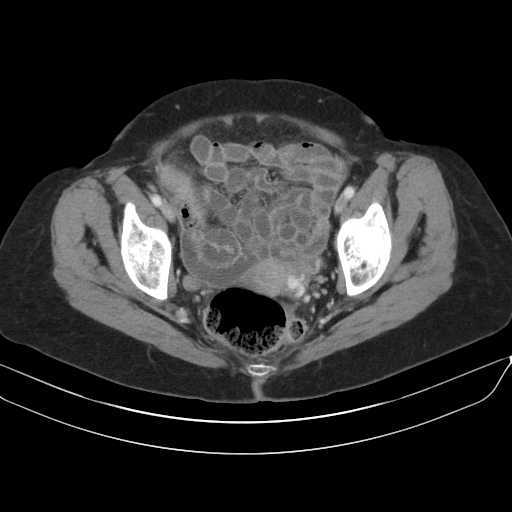
[im 33/92  soft-tissue]
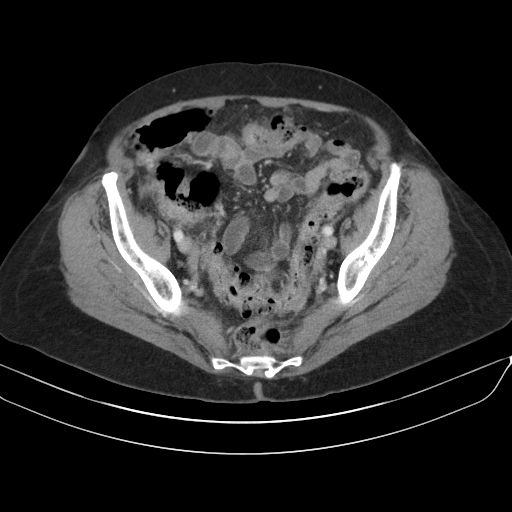
[im 40/92  soft-tissue]
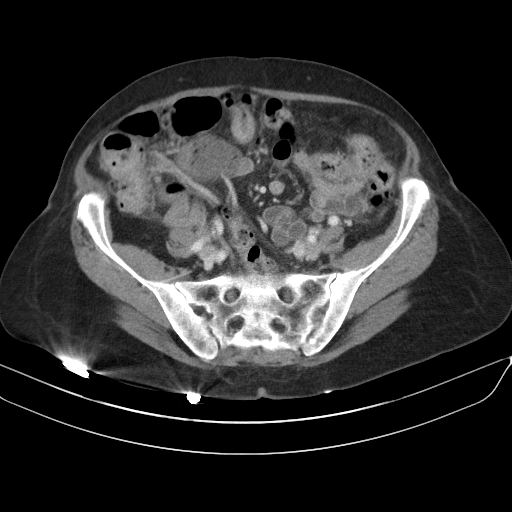
[im 53/92  soft-tissue]
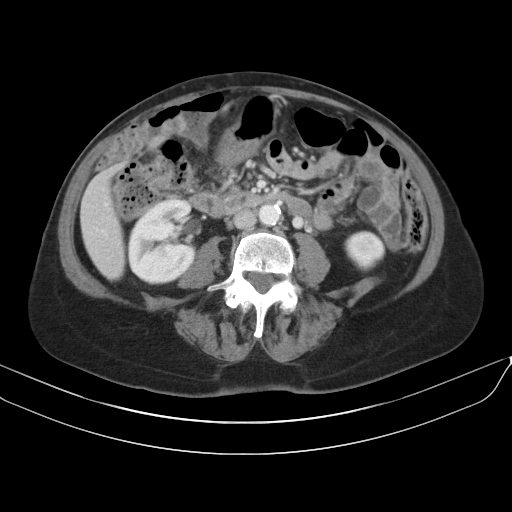
[im 59/92  soft-tissue]
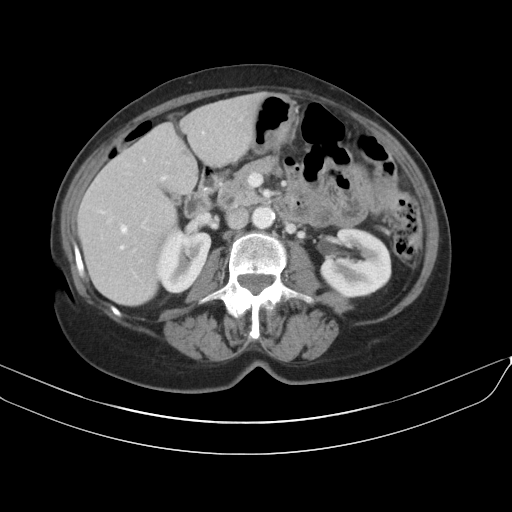
[im 66/92  soft-tissue]
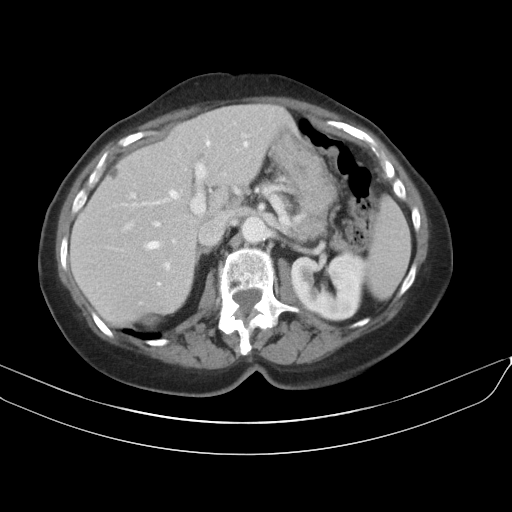
[im 66/92  lung]
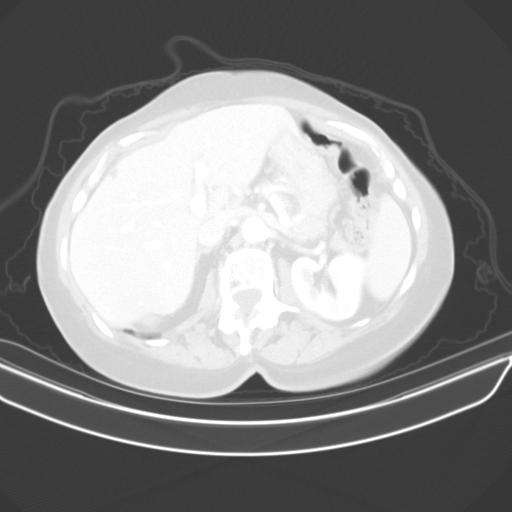
[im 66/92  bone]
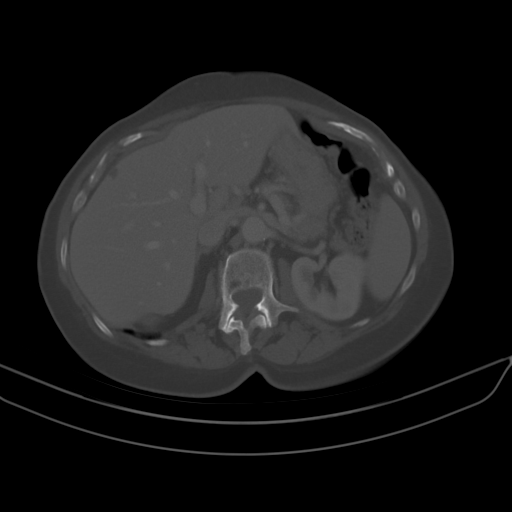
[im 72/92  soft-tissue]
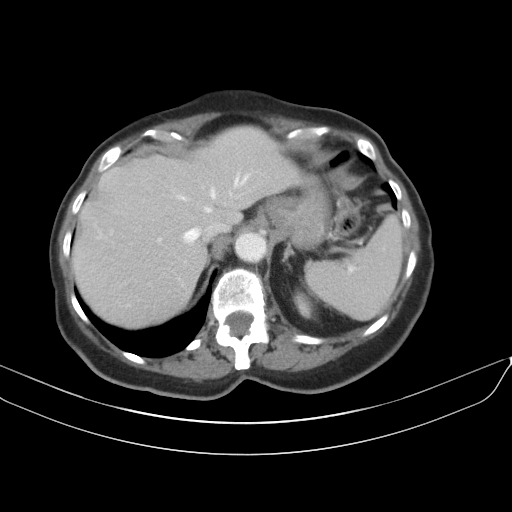
[im 72/92  lung]
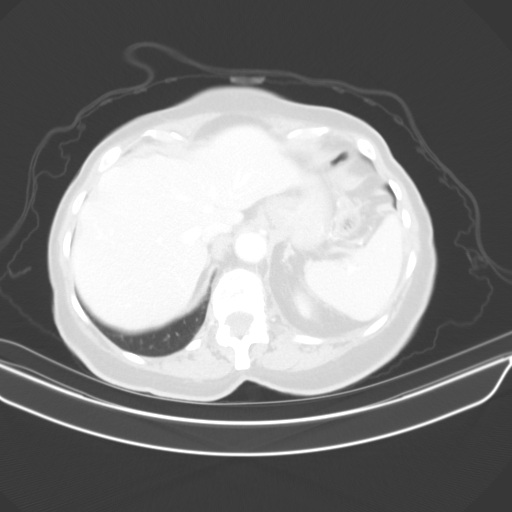
[im 79/92  soft-tissue]
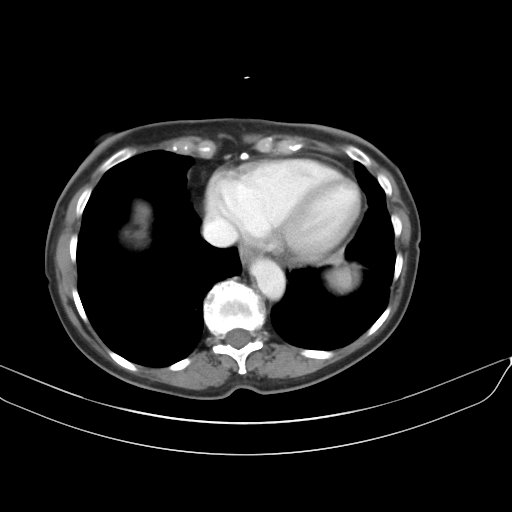
[im 79/92  lung]
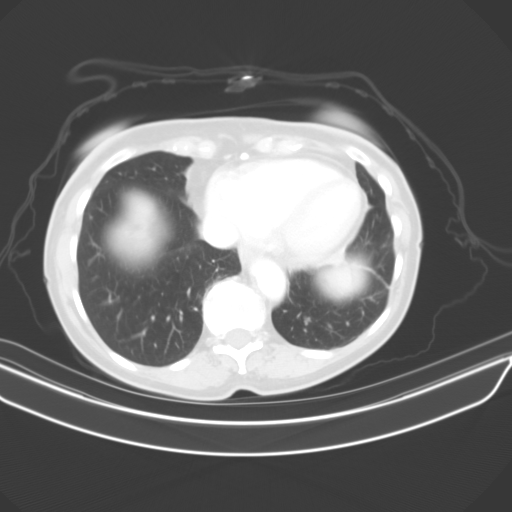
[im 85/92  soft-tissue]
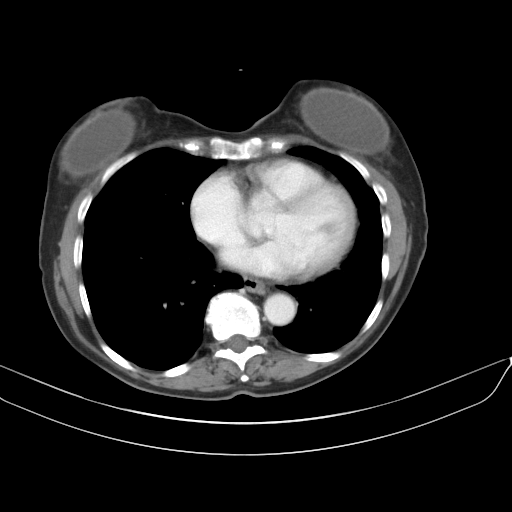
[im 85/92  lung]
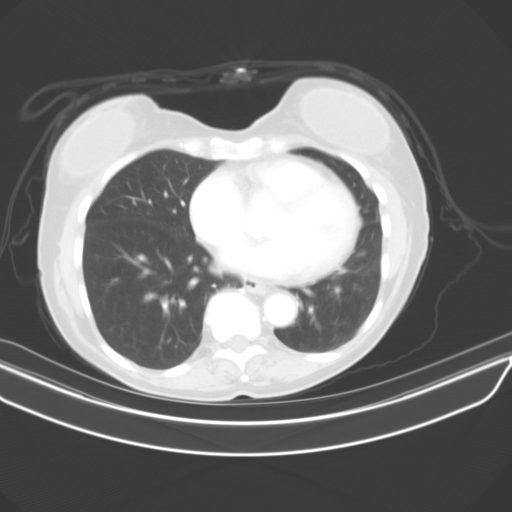

[12 of 32 positions shown; findings below may reference images not displayed]

FINDINGS: Lower chest: No acute abnormality.

Hepatobiliary: No solid liver abnormality is seen. No gallstones,
gallbladder wall thickening, or biliary dilatation.

Pancreas: Unremarkable. No pancreatic ductal dilatation or
surrounding inflammatory changes.

Spleen: Normal in size without significant abnormality.

Adrenals/Urinary Tract: Adrenal glands are unremarkable. Kidneys are
normal, without renal calculi, solid lesion, or hydronephrosis.
Bladder is unremarkable.

Stomach/Bowel: Stomach is within normal limits. Appendix appears
normal. There is an extremely redundant sigmoid colon, which loops
into the right hemiabdomen, with severe diverticulosis of the
descending and sigmoid colon. There is relatively focal wall
thickening and fat stranding of the distal sigmoid colon near the
rectosigmoid junction in the high right pelvis (series 2, image 58).
There is additional wall thickening and fat stranding of the distal
descending and proximal sigmoid colon in the left lower quadrant
(series 2, image 61).

Vascular/Lymphatic: Aortic atherosclerosis. No enlarged abdominal or
pelvic lymph nodes.

Reproductive: No mass or other significant abnormality.

Other: No abdominal wall hernia or abnormality. No abdominopelvic
ascites.

Musculoskeletal: No acute or significant osseous findings.
IMPRESSION: 1. There is an extremely redundant sigmoid colon, which loops into
the right hemiabdomen, with severe diverticulosis of the descending
and sigmoid colon.

2. There is relatively focal wall thickening and fat stranding of
the distal sigmoid colon near the rectosigmoid junction in the high
right pelvis.

3. Additional wall thickening and fat stranding of the distal
descending and proximal sigmoid colon in the left lower quadrant.

4. Findings suggest acute diverticulitis, but given multifocal
involvement alternative explanations include other nonspecific
infectious, inflammatory, or ischemic colitis. No evidence of
complicating perforation or abscess at this time.

Aortic Atherosclerosis ([id number]).

## 2022-09-05 DIAGNOSIS — R1030 Lower abdominal pain, unspecified: Secondary | ICD-10-CM | POA: Diagnosis not present

## 2022-09-05 DIAGNOSIS — R197 Diarrhea, unspecified: Secondary | ICD-10-CM | POA: Diagnosis not present

## 2022-09-10 ENCOUNTER — Other Ambulatory Visit: Payer: Self-pay | Admitting: Gastroenterology

## 2022-09-10 DIAGNOSIS — R10814 Left lower quadrant abdominal tenderness: Secondary | ICD-10-CM

## 2022-09-10 DIAGNOSIS — K580 Irritable bowel syndrome with diarrhea: Secondary | ICD-10-CM | POA: Diagnosis not present

## 2022-09-10 DIAGNOSIS — K529 Noninfective gastroenteritis and colitis, unspecified: Secondary | ICD-10-CM | POA: Diagnosis not present

## 2022-09-10 DIAGNOSIS — R000 Tachycardia, unspecified: Secondary | ICD-10-CM | POA: Diagnosis not present

## 2022-09-11 DIAGNOSIS — K529 Noninfective gastroenteritis and colitis, unspecified: Secondary | ICD-10-CM | POA: Diagnosis not present

## 2022-09-11 DIAGNOSIS — K580 Irritable bowel syndrome with diarrhea: Secondary | ICD-10-CM | POA: Diagnosis not present

## 2022-09-11 DIAGNOSIS — R10814 Left lower quadrant abdominal tenderness: Secondary | ICD-10-CM | POA: Diagnosis not present

## 2022-09-11 DIAGNOSIS — R197 Diarrhea, unspecified: Secondary | ICD-10-CM | POA: Diagnosis not present

## 2022-09-17 ENCOUNTER — Ambulatory Visit
Admission: RE | Admit: 2022-09-17 | Discharge: 2022-09-17 | Disposition: A | Discharge status: Home / Self Care | Payer: Medicare Other | Source: Ambulatory Visit | Attending: Gastroenterology | Admitting: Gastroenterology

## 2022-09-17 DIAGNOSIS — R109 Unspecified abdominal pain: Secondary | ICD-10-CM | POA: Diagnosis not present

## 2022-09-17 DIAGNOSIS — M47816 Spondylosis without myelopathy or radiculopathy, lumbar region: Secondary | ICD-10-CM | POA: Diagnosis not present

## 2022-09-17 DIAGNOSIS — K589 Irritable bowel syndrome without diarrhea: Secondary | ICD-10-CM | POA: Diagnosis not present

## 2022-09-17 DIAGNOSIS — R10814 Left lower quadrant abdominal tenderness: Secondary | ICD-10-CM

## 2022-09-17 DIAGNOSIS — M48061 Spinal stenosis, lumbar region without neurogenic claudication: Secondary | ICD-10-CM | POA: Diagnosis not present

## 2022-09-17 DIAGNOSIS — R197 Diarrhea, unspecified: Secondary | ICD-10-CM | POA: Diagnosis not present

## 2022-09-17 DIAGNOSIS — I700 Atherosclerosis of aorta: Secondary | ICD-10-CM | POA: Diagnosis not present

## 2022-09-17 MED ORDER — IOPAMIDOL (ISOVUE-300) INJECTION 61%
100.0000 mL | Freq: Once | INTRAVENOUS | Status: AC | PRN
  Administered 2022-09-17: 100 mL via INTRAVENOUS

## 2022-11-11 DIAGNOSIS — D225 Melanocytic nevi of trunk: Secondary | ICD-10-CM | POA: Diagnosis not present

## 2022-11-11 DIAGNOSIS — L821 Other seborrheic keratosis: Secondary | ICD-10-CM | POA: Diagnosis not present

## 2022-11-11 DIAGNOSIS — L814 Other melanin hyperpigmentation: Secondary | ICD-10-CM | POA: Diagnosis not present

## 2022-11-11 DIAGNOSIS — L578 Other skin changes due to chronic exposure to nonionizing radiation: Secondary | ICD-10-CM | POA: Diagnosis not present

## 2022-11-12 DIAGNOSIS — K580 Irritable bowel syndrome with diarrhea: Secondary | ICD-10-CM | POA: Diagnosis not present

## 2022-11-12 DIAGNOSIS — R10814 Left lower quadrant abdominal tenderness: Secondary | ICD-10-CM | POA: Diagnosis not present

## 2022-11-12 DIAGNOSIS — K529 Noninfective gastroenteritis and colitis, unspecified: Secondary | ICD-10-CM | POA: Diagnosis not present

## 2022-11-12 DIAGNOSIS — R933 Abnormal findings on diagnostic imaging of other parts of digestive tract: Secondary | ICD-10-CM | POA: Diagnosis not present

## 2022-12-16 DIAGNOSIS — F5101 Primary insomnia: Secondary | ICD-10-CM | POA: Diagnosis not present

## 2022-12-16 DIAGNOSIS — E039 Hypothyroidism, unspecified: Secondary | ICD-10-CM | POA: Diagnosis not present

## 2022-12-16 DIAGNOSIS — I700 Atherosclerosis of aorta: Secondary | ICD-10-CM | POA: Diagnosis not present

## 2022-12-16 DIAGNOSIS — R739 Hyperglycemia, unspecified: Secondary | ICD-10-CM | POA: Diagnosis not present

## 2022-12-16 DIAGNOSIS — Z23 Encounter for immunization: Secondary | ICD-10-CM | POA: Diagnosis not present

## 2022-12-16 DIAGNOSIS — E7800 Pure hypercholesterolemia, unspecified: Secondary | ICD-10-CM | POA: Diagnosis not present

## 2022-12-16 DIAGNOSIS — K529 Noninfective gastroenteritis and colitis, unspecified: Secondary | ICD-10-CM | POA: Diagnosis not present

## 2022-12-16 DIAGNOSIS — Z0000 Encounter for general adult medical examination without abnormal findings: Secondary | ICD-10-CM | POA: Diagnosis not present

## 2022-12-17 ENCOUNTER — Ambulatory Visit: Payer: Medicare Other | Attending: Cardiology | Admitting: Cardiology

## 2022-12-17 ENCOUNTER — Ambulatory Visit (INDEPENDENT_AMBULATORY_CARE_PROVIDER_SITE_OTHER): Payer: Medicare Other

## 2022-12-17 ENCOUNTER — Encounter: Payer: Self-pay | Admitting: Cardiology

## 2022-12-17 VITALS — BP 118/70 | HR 83 | Ht 63.0 in | Wt 137.0 lb

## 2022-12-17 DIAGNOSIS — R000 Tachycardia, unspecified: Secondary | ICD-10-CM | POA: Diagnosis not present

## 2022-12-17 DIAGNOSIS — K529 Noninfective gastroenteritis and colitis, unspecified: Secondary | ICD-10-CM | POA: Diagnosis not present

## 2022-12-17 DIAGNOSIS — R9431 Abnormal electrocardiogram [ECG] [EKG]: Secondary | ICD-10-CM

## 2022-12-17 NOTE — Patient Instructions (Signed)
Medication Instructions:  The current medical regimen is effective;  continue present plan and medications.  *If you need a refill on your cardiac medications before your next appointment, please call your pharmacy*  Testing/Procedures: Your physician has requested that you have an echocardiogram. Echocardiography is a painless test that uses sound waves to create images of your heart. It provides your doctor with information about the size and shape of your heart and how well your heart's chambers and valves are working. This procedure takes approximately one hour. There are no restrictions for this procedure. Please do NOT wear cologne, perfume, aftershave, or lotions (deodorant is allowed). Please arrive 15 minutes prior to your appointment time.  Please note: We ask at that you not bring children with you during ultrasound (echo/ vascular) testing. Due to room size and safety concerns, children are not allowed in the ultrasound rooms during exams. Our front office staff cannot provide observation of children in our lobby area while testing is being conducted. An adult accompanying a patient to their appointment will only be allowed in the ultrasound room at the discretion of the ultrasound technician under special circumstances. We apologize for any inconvenience.  ZIO XT- Long Term Monitor Instructions  Your physician has requested you wear a ZIO patch monitor for 14 days.  This is a single patch monitor. [employer] supplies one patch monitor per enrollment. Additional stickers are not available. Please do not apply patch if you will be having a Nuclear Stress Test,  Echocardiogram, Cardiac CT, MRI, or Chest Xray during the period you would be wearing the  monitor. The patch cannot be worn during these tests. You cannot remove and re-apply the  ZIO XT patch monitor.  Your ZIO patch monitor will be mailed 3 day USPS to your address on file. It may take 3-5 days  to receive your monitor after  you have been enrolled.  Once you have received your monitor, please review the enclosed instructions. Your monitor  has already been registered assigning a specific monitor serial # to you.  Billing and Patient Assistance Program Information  We have supplied [employer] with any of your insurance information on file for billing purposes. [employer] offers a sliding scale Patient Assistance Program for patients that do not have  insurance, or whose insurance does not completely cover the cost of the ZIO monitor.  You must apply for the Patient Assistance Program to qualify for this discounted rate.  To apply, please call [employer] at [phone], select option 4, select option 2, ask to apply for  Patient Assistance Program. Meredeth Ide will ask your household income, and how many people  are in your household. They will quote your out-of-pocket cost based on that information.  [employer] will also be able to set up a 11-month, interest-free payment plan if needed.  Applying the monitor   Shave hair from upper left chest.  Hold abrader disc by orange tab. Rub abrader in 40 strokes over the upper left chest as  indicated in your monitor instructions.  Clean area with 4 enclosed alcohol pads. Let dry.  Apply patch as indicated in monitor instructions. Patch will be placed under collarbone on left  side of chest with arrow pointing upward.  Rub patch adhesive wings for 2 minutes. Remove white label marked "1". Remove the white  label marked "2". Rub patch adhesive wings for 2 additional minutes.  While looking in a mirror, press and release button in center of patch. A small green light will  flash  3-4 times. This will be your only indicator that the monitor has been turned on.  Do not shower for the first 24 hours. You may shower after the first 24 hours.  Press the button if you feel a symptom. You will hear a small click. Record Date, Time and  Symptom in the Patient Logbook.  When you are ready to  remove the patch, follow instructions on the last 2 pages of Patient  Logbook. Stick patch monitor onto the last page of Patient Logbook.  Place Patient Logbook in the blue and white box. Use locking tab on box and tape box closed  securely. The blue and white box has prepaid postage on it. Please place it in the mailbox as  soon as possible. Your physician should have your test results approximately 7 days after the  monitor has been mailed back to [facility].  Call [facility] Customer Care at [phone] if you have questions regarding  your ZIO XT patch monitor. Call them immediately if you see an orange light blinking on your  monitor.  If your monitor falls off in less than 4 days, contact our Monitor department at [phone].  If your monitor becomes loose or falls off after 4 days call [employer] at [phone] for  suggestions on securing your monitor   Follow-Up: At [facility], you and your health needs are our priority.  As part of our continuing mission to provide you with exceptional heart care, we have created designated Provider Care Teams.  These Care Teams include your primary Cardiologist (physician) and Advanced Practice Providers (APPs -  Physician Assistants and Nurse Practitioners) who all work together to provide you with the care you need, when you need it.  We recommend signing up for the patient portal called "MyChart".  Sign up information is provided on this After Visit Summary.  MyChart is used to connect with patients for Virtual Visits (Telemedicine).  Patients are able to view lab/test results, encounter notes, upcoming appointments, etc.  Non-urgent messages can be sent to your provider as well.   To learn more about what you can do with MyChart, go to [email or web address].    Your next appointment:   Follow up as needed based on the results of the above testing.

## 2022-12-17 NOTE — Progress Notes (Unsigned)
Enrolled for [employer] to mail a ZIO XT long term holter monitor to the patients address on file.  

## 2022-12-17 NOTE — Progress Notes (Signed)
Cardiology Office Note:  .   Date:  12/17/2022  ID:  Connie Diaz, DOB 07-22-1945, MRN [id number] PCP: Noberto Retort, MD  [facility] Providers Cardiologist:  None     History of Present Illness: Marland Kitchen   Connie Diaz is a 77 y.o. female Discussed with the use of AI scribe  History of Present Illness   The patient, a 77 year old with a history of ulcerative colitis, was referred for evaluation of tachycardia. The patient reported experiencing episodes of elevated heart rate during colitis flare-ups, with rates reaching as high as 156 bpm, particularly at night. These episodes were associated with severe abdominal pain, described as worse than labor pains. The patient denied experiencing any chest pain during these episodes.  The patient's colitis flare-ups were managed with budesonide, which was recently discontinued. The patient also reported dietary modifications to manage her colitis, avoiding oils, sugars, lactose, gluten, and raw vegetables.  The patient's family history was notable for a father who had a heart attack, which the patient suspected was due to a blood clot following discontinuation of Coumadin. The patient denied any personal history of smoking.  The patient's home monitoring device occasionally indicated atrial fibrillation and irregular heart rhythms during episodes of high heart rate. However, the patient's EKG at the time of the consultation was reported as normal.      Fayrene Fearing, husband, drummer, we see as well.     ROS: No CP, no SOB  Studies Reviewed: Marland Kitchen   EKG Interpretation Date/Time:  Tuesday December 17 2022 14:41:49 EST Ventricular Rate:  76 PR Interval:  158 QRS Duration:  84 QT Interval:  374 QTC Calculation: 420 R Axis:   -42  Text Interpretation: Normal sinus rhythm Left axis deviation Minimal voltage criteria for LVH, may be normal variant ( R in aVL ) Nonspecific T wave abnormality When compared with ECG of 28-Nov-2014 11:28, No  significant change was found Confirmed by Donato Schultz ([id number]) on 12/17/2022 2:45:35 PM    Hgb - 12.3 LDL 133 Creat 0.67 TSH 1.28  Prior office notes reviewed  Risk Assessment/Calculations:            Physical Exam:   VS:  BP 118/70   Pulse 83   Ht 5\' 3"  (1.6 m)   Wt 137 lb (62.1 kg)   SpO2 95%   BMI 24.27 kg/m    Wt Readings from Last 3 Encounters:  12/17/22 137 lb (62.1 kg)  06/16/20 142 lb (64.4 kg)  03/19/17 139 lb 12.8 oz (63.4 kg)    GEN: Well nourished, well developed in no acute distress NECK: No JVD; No carotid bruits CARDIAC: RRR, no murmurs, no rubs, no gallops RESPIRATORY:  Clear to auscultation without rales, wheezing or rhonchi  ABDOMEN: Soft, non-tender, non-distended EXTREMITIES:  No edema; No deformity   ASSESSMENT AND PLAN: .    Assessment and Plan    Tachycardia Referred for evaluation of tachycardia. History of elevated heart rates during colitis flare-ups, reaching up to 156 bpm. No current AV nodal blocking agents. EKG today is normal. Differential includes physiologic response to underlying infection or pain from colitis flare versus atrial fibrillation or other arrhythmias. No chest pain reported. Explained that tachycardia could be a normal response to pain and inflammation. Discussed the importance of ruling out atrial fibrillation or other arrhythmias through further testing. - Order echocardiogram to assess heart structure and function - Order Zio monitor for continuous heart monitoring for two weeks to check for atrial fibrillation  or other arrhythmias  Ulcerative Colitis Ulcerative colitis with recent flare in August. Symptoms included severe pain and elevated heart rates. Currently not on corticosteroids; previously on budesonide, now weaned off. Manages condition with dietary restrictions. Discussed that severe pain from colitis flare could contribute to elevated heart rates. - Continue dietary management - Follow up with gastroenterologist  regarding medication management  General Health Maintenance No smoking history. Family history of myocardial infarction in father, likely due to a blood clot. No other significant family history of heart disease.  Follow-up - Schedule echocardiogram and Zio monitor - Pam, the nurse, to provide details and scheduling information.              Signed, Donato Schultz, MD

## 2022-12-20 DIAGNOSIS — R9431 Abnormal electrocardiogram [ECG] [EKG]: Secondary | ICD-10-CM | POA: Diagnosis not present

## 2022-12-20 DIAGNOSIS — R000 Tachycardia, unspecified: Secondary | ICD-10-CM

## 2023-01-10 DIAGNOSIS — R000 Tachycardia, unspecified: Secondary | ICD-10-CM | POA: Diagnosis not present

## 2023-01-10 DIAGNOSIS — R9431 Abnormal electrocardiogram [ECG] [EKG]: Secondary | ICD-10-CM | POA: Diagnosis not present

## 2023-01-16 DIAGNOSIS — M170 Bilateral primary osteoarthritis of knee: Secondary | ICD-10-CM | POA: Diagnosis not present

## 2023-01-16 DIAGNOSIS — M1711 Unilateral primary osteoarthritis, right knee: Secondary | ICD-10-CM | POA: Diagnosis not present

## 2023-01-16 DIAGNOSIS — M1712 Unilateral primary osteoarthritis, left knee: Secondary | ICD-10-CM | POA: Diagnosis not present

## 2023-01-24 ENCOUNTER — Ambulatory Visit (HOSPITAL_COMMUNITY): Payer: Medicare Other | Attending: Cardiology

## 2023-01-24 DIAGNOSIS — R9431 Abnormal electrocardiogram [ECG] [EKG]: Secondary | ICD-10-CM | POA: Insufficient documentation

## 2023-01-24 LAB — ECHOCARDIOGRAM COMPLETE
Area-P 1/2: 3.31 cm2
P 1/2 time: 488 ms
S' Lateral: 2.4 cm

## 2023-01-30 ENCOUNTER — Encounter: Payer: Self-pay | Admitting: Cardiology

## 2023-02-03 ENCOUNTER — Encounter: Payer: Self-pay | Admitting: Cardiology

## 2023-03-10 DIAGNOSIS — L570 Actinic keratosis: Secondary | ICD-10-CM | POA: Diagnosis not present

## 2023-03-31 DIAGNOSIS — K529 Noninfective gastroenteritis and colitis, unspecified: Secondary | ICD-10-CM | POA: Diagnosis not present

## 2023-03-31 DIAGNOSIS — K580 Irritable bowel syndrome with diarrhea: Secondary | ICD-10-CM | POA: Diagnosis not present

## 2023-04-02 DIAGNOSIS — K529 Noninfective gastroenteritis and colitis, unspecified: Secondary | ICD-10-CM | POA: Diagnosis not present

## 2023-05-22 DIAGNOSIS — M1711 Unilateral primary osteoarthritis, right knee: Secondary | ICD-10-CM | POA: Diagnosis not present

## 2023-06-10 DIAGNOSIS — R739 Hyperglycemia, unspecified: Secondary | ICD-10-CM | POA: Diagnosis not present

## 2023-06-10 DIAGNOSIS — E7800 Pure hypercholesterolemia, unspecified: Secondary | ICD-10-CM | POA: Diagnosis not present

## 2023-06-10 DIAGNOSIS — F5101 Primary insomnia: Secondary | ICD-10-CM | POA: Diagnosis not present

## 2023-06-10 DIAGNOSIS — E039 Hypothyroidism, unspecified: Secondary | ICD-10-CM | POA: Diagnosis not present

## 2023-06-16 DIAGNOSIS — K5190 Ulcerative colitis, unspecified, without complications: Secondary | ICD-10-CM | POA: Diagnosis not present

## 2023-06-19 DIAGNOSIS — K529 Noninfective gastroenteritis and colitis, unspecified: Secondary | ICD-10-CM | POA: Diagnosis not present

## 2023-06-20 DIAGNOSIS — K529 Noninfective gastroenteritis and colitis, unspecified: Secondary | ICD-10-CM | POA: Diagnosis not present

## 2023-08-12 DIAGNOSIS — K529 Noninfective gastroenteritis and colitis, unspecified: Secondary | ICD-10-CM | POA: Diagnosis not present

## 2023-08-19 DIAGNOSIS — H401121 Primary open-angle glaucoma, left eye, mild stage: Secondary | ICD-10-CM | POA: Diagnosis not present

## 2023-08-19 DIAGNOSIS — Z961 Presence of intraocular lens: Secondary | ICD-10-CM | POA: Diagnosis not present

## 2023-08-19 DIAGNOSIS — H40011 Open angle with borderline findings, low risk, right eye: Secondary | ICD-10-CM | POA: Diagnosis not present

## 2023-08-19 DIAGNOSIS — H04123 Dry eye syndrome of bilateral lacrimal glands: Secondary | ICD-10-CM | POA: Diagnosis not present

## 2023-09-16 DIAGNOSIS — M1711 Unilateral primary osteoarthritis, right knee: Secondary | ICD-10-CM | POA: Diagnosis not present

## 2024-01-02 ENCOUNTER — Other Ambulatory Visit: Payer: Self-pay | Admitting: Nurse Practitioner

## 2024-01-02 DIAGNOSIS — R102 Pelvic and perineal pain unspecified side: Secondary | ICD-10-CM

## 2024-01-02 DIAGNOSIS — R140 Abdominal distension (gaseous): Secondary | ICD-10-CM

## 2024-01-02 DIAGNOSIS — Z8041 Family history of malignant neoplasm of ovary: Secondary | ICD-10-CM

## 2024-01-13 ENCOUNTER — Other Ambulatory Visit

## 2024-02-03 ENCOUNTER — Ambulatory Visit
Admission: RE | Admit: 2024-02-03 | Discharge: 2024-02-03 | Disposition: A | Source: Ambulatory Visit | Attending: Nurse Practitioner

## 2024-02-03 DIAGNOSIS — R140 Abdominal distension (gaseous): Secondary | ICD-10-CM

## 2024-02-03 DIAGNOSIS — R102 Pelvic and perineal pain unspecified side: Secondary | ICD-10-CM

## 2024-02-03 DIAGNOSIS — Z8041 Family history of malignant neoplasm of ovary: Secondary | ICD-10-CM
